# Patient Record
Sex: Female | Born: 1975 | Race: Black or African American | Hispanic: No | Marital: Single | Smoking: Former smoker
Health system: Southern US, Community
[De-identification: ages and names within clinical notes are randomized; demographics above are authoritative.]

## PROBLEM LIST (undated history)

## (undated) DIAGNOSIS — I4819 Other persistent atrial fibrillation: Secondary | ICD-10-CM

## (undated) DIAGNOSIS — E669 Obesity, unspecified: Secondary | ICD-10-CM

## (undated) DIAGNOSIS — N730 Acute parametritis and pelvic cellulitis: Secondary | ICD-10-CM

## (undated) DIAGNOSIS — I5022 Chronic systolic (congestive) heart failure: Secondary | ICD-10-CM

## (undated) HISTORY — DX: Acute parametritis and pelvic cellulitis: N73.0

---

## 2007-08-26 ENCOUNTER — Emergency Department (HOSPITAL_COMMUNITY): Admission: EM | Admit: 2007-08-26 | Discharge: 2007-08-26 | Payer: Self-pay | Admitting: Family Medicine

## 2007-09-07 ENCOUNTER — Emergency Department (HOSPITAL_COMMUNITY): Admission: EM | Admit: 2007-09-07 | Discharge: 2007-09-07 | Payer: Self-pay | Admitting: Family Medicine

## 2007-09-19 DIAGNOSIS — N730 Acute parametritis and pelvic cellulitis: Secondary | ICD-10-CM

## 2007-09-19 HISTORY — DX: Acute parametritis and pelvic cellulitis: N73.0

## 2008-02-08 ENCOUNTER — Encounter: Payer: Self-pay | Admitting: Emergency Medicine

## 2008-02-08 ENCOUNTER — Inpatient Hospital Stay (HOSPITAL_COMMUNITY): Admission: EM | Admit: 2008-02-08 | Discharge: 2008-02-11 | Payer: Self-pay | Admitting: Obstetrics & Gynecology

## 2008-03-11 ENCOUNTER — Ambulatory Visit: Payer: Self-pay | Admitting: Obstetrics and Gynecology

## 2008-03-11 ENCOUNTER — Ambulatory Visit (HOSPITAL_COMMUNITY): Admission: RE | Admit: 2008-03-11 | Discharge: 2008-03-11 | Payer: Self-pay | Admitting: Family Medicine

## 2008-03-11 ENCOUNTER — Encounter: Payer: Self-pay | Admitting: Obstetrics and Gynecology

## 2008-04-22 ENCOUNTER — Ambulatory Visit (HOSPITAL_COMMUNITY): Admission: RE | Admit: 2008-04-22 | Discharge: 2008-04-22 | Payer: Self-pay | Admitting: Family Medicine

## 2008-04-22 ENCOUNTER — Ambulatory Visit: Payer: Self-pay | Admitting: Obstetrics and Gynecology

## 2008-05-14 ENCOUNTER — Inpatient Hospital Stay (HOSPITAL_COMMUNITY): Admission: AD | Admit: 2008-05-14 | Discharge: 2008-05-14 | Payer: Self-pay | Admitting: Obstetrics & Gynecology

## 2008-09-02 ENCOUNTER — Ambulatory Visit: Payer: Self-pay | Admitting: Obstetrics & Gynecology

## 2009-06-16 ENCOUNTER — Emergency Department (HOSPITAL_COMMUNITY): Admission: EM | Admit: 2009-06-16 | Discharge: 2009-06-16 | Payer: Self-pay | Admitting: Emergency Medicine

## 2009-07-08 ENCOUNTER — Encounter: Admission: RE | Admit: 2009-07-08 | Discharge: 2009-07-30 | Payer: Self-pay | Admitting: Sports Medicine

## 2010-03-30 ENCOUNTER — Ambulatory Visit: Payer: Self-pay | Admitting: Obstetrics and Gynecology

## 2010-03-30 LAB — CONVERTED CEMR LAB: HCV Ab: NEGATIVE

## 2010-03-31 ENCOUNTER — Encounter: Payer: Self-pay | Admitting: Obstetrics and Gynecology

## 2010-03-31 LAB — CONVERTED CEMR LAB
Trich, Wet Prep: NONE SEEN
Yeast Wet Prep HPF POC: NONE SEEN

## 2010-06-15 ENCOUNTER — Ambulatory Visit: Payer: Self-pay | Admitting: Obstetrics and Gynecology

## 2010-06-16 ENCOUNTER — Encounter: Payer: Self-pay | Admitting: Obstetrics and Gynecology

## 2010-06-16 LAB — CONVERTED CEMR LAB: Yeast Wet Prep HPF POC: NONE SEEN

## 2011-01-31 NOTE — Group Therapy Note (Signed)
NAMEKORIE, STREAT NO.:  [id number]   MEDICAL RECORD NO.:  [id number]          PATIENT TYPE:  WOC   LOCATION:  [facility]                   FACILITY:  [facility]   PHYSICIAN:  Argentina Donovan, MD        DATE OF BIRTH:  03-31-1976   DATE OF SERVICE:                                  CLINIC NOTE   The patient is a 35 year old African American female gravida 2, para 1-1-  0-2 who was admitted to [facility] on Feb 08, 2008, with pelvic  abscess and treated for about 6 days with intravenous antibiotics.  The  patient today had a followup ultrasound which showed resolution of  complex cystic lesion in the left adnexa in the pelvic cul-de-sac since  her prior study consistent with resolution of tubo-ovarian abscess.   PHYSICAL EXAMINATION:  ABDOMEN:  On examination today the abdomen is  soft, flat, nontender.  No masses or organomegaly.  EXTERNAL GENITALIA:  Normal.  BUS within normal limits.  VAGINA:  Is clean and well rugated.  Cervix clean and parous.  Uterus in  normal size, shape and consistency and fullness in the cul-de-sac was  noted.  Adnexa could not be palpated due to habitus of the patient.   PLAN:  On this patient is to follow her up in about 6 weeks with another  ultrasound.  The patient is not experiencing any significant discomfort  but she did not have much when she was having the abscess active and  inflamed and so her pain tolerance is probably greater than most.  I  will have her schedule another appointment after her 6 week ultrasound  and evaluate her at that point.  I have talked about the possibility of  future surgery being necessary and also Pap smear was done today.   IMPRESSION:  Resolution of pelvic abscess.           ______________________________  Argentina Donovan, MD     PR/MEDQ  D:  03/11/2008  T:  03/11/2008  Job:  [id number]

## 2011-01-31 NOTE — H&P (Signed)
NAMEMISHIKA, FLIPPEN                ACCOUNT NO.:  [id number]   MEDICAL RECORD NO.:  [id number]          PATIENT TYPE:  INP   LOCATION:  9305                          [facility]   PHYSICIAN:  Kendra H. Tenny Craw, MD     DATE OF BIRTH:  Sep 20, 1975   DATE OF ADMISSION:  02/08/2008  DATE OF DISCHARGE:                              HISTORY & PHYSICAL   CHIEF COMPLAINT:  Abdominal pain.   HISTORY OF PRESENT ILLNESS:  Lisa Schaefer is a 35 year old G2, P 1-1-0-2  who presented to the [facility] Emergency Room this morning with a chief  complaint of vague abdominal pain that had been ongoing for several  days.  She states that her discomfort began on the evening of Wednesday,  Feb 05, 2008, with upper abdominal pain, emesis, and constipation.  She  felt poorly again on Thursday, but on Friday she was feeling better and  actually went to work.  She was planning to go to work today; however,  upon the urging of her mother, she presented to the emergency room for  evaluation.  She attributed her symptoms to constipation as she has not  had a bowel movement since Tuesday.  She does endorse running a fever at  home, the highest of which was 101.  Her last menstrual period started  on Feb 06, 2008.  She does note some increase in vaginal discharge over  the last several days; however, attributed this to her pending menses  and denies any vaginal irritation.   PAST MEDICAL HISTORY:  None.   PAST SURGICAL HISTORY:  None.   PAST OBSTETRICS HISTORY:  Gravida 2, para 1-1-0-1.  She is status post  spontaneous vaginal delivery x2.  In 1993, she had a full-term  spontaneous vaginal delivery without complications of a 6 pound 5 ounce  baby.  In 1993, she had a 32-week spontaneous vaginal delivery after  premature preterm rupture of membranes.  The baby weighing 3 pounds 12  ounces.   PAST GYN HISTORY:  She endorses frequent abnormal Pap smears.  Her last  Pap smear was normal.  However, she has had one in  several years.  She  does endorse a history of gonorrhea and Chlamydia 5 years ago.  She  denies any history of pelvic inflammatory disease.  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  Her last outbreak of which was 2-3  months ago.  She is not currently using anything for contraception and  not attempting pregnancy.  Her last sexual intercourse was 3 weeks ago  with the same partner that she has been with for the past 1-1/2 years.  She denies any other sexual partners.  She denies condom use.  She has  no knowledge of her partner having other sexual partners or having any  symptoms of sexually transmitted diseases.   SOCIAL HISTORY:  Positive for half a pack of tobacco daily and endorses  some social alcohol use, 1-2 drinks weekly.  She denies drug abuse.  She  currently works as an elder Engineer, manufacturing systems.   FAMILY HISTORY:  A sister with hypertension.  Mother with diabetes  mellitus.  Father who is deceased at age 82 secondary to colon cancer.  Maternal grandmother with breast cancer.  She denies any family history  of uterine or ovarian cancer.   MEDICATIONS:  None.   ALLERGIES:  No known drug allergies.   PHYSICAL EXAMINATION:  VITAL SIGNS:  Afebrile, temperature current 98.9,  respirations 18, pulse 88-101, and blood pressure is 128-152/80-92.  Alert and oriented x3, in no apparent distress.  HEENT:  Moist mucous membranes.  Sclerae anicteric.  NECK: Supple.  No thyromegaly.  No cervical, supraclavicular, or  axillary lymphadenopathy.  HEART:  Regular rate and rhythm, a 2/6 murmur, best heard at the left  upper sternal border is noted.  ABDOMEN:  Soft, nontender, and nondistended.  No rebound or guarding is  noted.  PELVIC:  On bimanual exam, normal external female genitalia.  Vagina is  pink with positive menstrual fluid in the vaginal vault.  The cervix is  without lesions.  No mucopurulent discharge is noted.  No cervical  motion tenderness is noted.  The uterus  is mobile, approximately 8-week  size.  Upon palpation of the posterior uterus, the patient is slightly  tender.  The adnexa are nonpalpable bilaterally.  The patient endorses  slight tenderness on the left with deep palpation.   A CT of the abdomen and pelvis was performed at [facility] Emergency  Room, which demonstrated cholelithiasis and involving the left adnexa, a  fluid filled mass 4.3 x 5.4 cm which was consistent with abscess.  Inferior to this was another fluid collection, which may be an abscess  versus a hydrosalpinx and another fluid collection in the posterior cul-  de-sac, anterior to the rectum measuring 4.2 x 4.2, and also suspicious  for abscess.  Edema of the soft tissue of the pelvis is noted.   LABORATORY DATA:  White count 13, hemoglobin 13.9, hematocrit 41.1, and  platelets 374,000.  Sodium 136, potassium 3.3, chloride 102, carbon  dioxide 25, BUN 6, creatinine 0.8, glucose 106, AST 19, ALT 32, and  total bilirubin is slightly elevated at 1.3.  Urinalysis demonstrates  large hemoglobin, positive nitrites, and positive leukocyte esterase, 3-  6 white cells, 21-50 red cells, and mucus is present.  Gonorrhea and  Chlamydia cultures and RPR were collected in [facility] Emergency Room.  These were also recollected in the maternity admissions unit.  Urine  pregnancy test was negative.   ASSESSMENT/PLAN:  This is a 35 year old G2, P1-1-0-2 admitted for pelvic  inflammatory disease and probable left tubo-ovarian abscess and pelvic  abscesses.  She will be admitted to the GYN floor, started on  ampicillin, gentamicin, and clindamycin for pelvic inflammatory disease  and tubo-ovarian abscess.  Gonorrhea, Chlamydia, and wet preps are  pending.  A urine culture is pending.  We will check her CBC in the a.m.      Freddrick March. Tenny Craw, MD  Electronically Signed     KHR/MEDQ  D:  02/08/2008  T:  02/09/2008  Job:  [id number]

## 2011-02-03 NOTE — Discharge Summary (Signed)
NAMEADRIENNA, KARIS                ACCOUNT NO.:  [id number]   MEDICAL RECORD NO.:  [id number]          PATIENT TYPE:  INP   LOCATION:  9305                          [facility]   PHYSICIAN:  Kendra H. Tenny Craw, MD     DATE OF BIRTH:  [birth year]   DATE OF ADMISSION:  02/08/2008  DATE OF DISCHARGE:  02/11/2008                               DISCHARGE SUMMARY   Discharge Diagnoses:  1. Pelvic Inflammitory disease  2. Multiple pelvic asbcsesses   Hospital Procedures  1. CT scan abdomen and pelvis  2. IV antibiotics   Ms. Blackham is a 35 year old G2 P1-1-0-2 who was admitted to the hospital  on Feb 08, 2008 after a CT scan performed for abdominal pain  demonstrated multiple pelvic abscesses consistent with pelvic  inflammatory disease and tubo-ovarian abscesses.  Please see her  admission history and physical for full details of her history.  During  her hospitalization, she received IV antibiotics and pain medication.  On hospital day #4, she had defervesced her fever and was tolerating  regular diet and ambulating well, the decision was made to discharge  home.  She received prescriptions for Augmentin and Flagyl to take  orally at home and was instructed to follow up with the GYN Clinic for  further management.  On Feb 11, 2008, she was discharged home.      Freddrick March. Tenny Craw, MD  Electronically Signed     KHR/MEDQ  D:  05/31/2008  T:  06/01/2008  Job:  [id number]

## 2011-05-24 ENCOUNTER — Ambulatory Visit (INDEPENDENT_AMBULATORY_CARE_PROVIDER_SITE_OTHER): Payer: Medicaid Other | Admitting: Family Medicine

## 2011-05-24 ENCOUNTER — Other Ambulatory Visit: Payer: Self-pay | Admitting: Family Medicine

## 2011-05-24 ENCOUNTER — Other Ambulatory Visit (HOSPITAL_COMMUNITY)
Admission: RE | Admit: 2011-05-24 | Discharge: 2011-05-24 | Disposition: A | Discharge status: Home / Self Care | Payer: Medicaid Other | Source: Ambulatory Visit | Attending: Family Medicine | Admitting: Family Medicine

## 2011-05-24 VITALS — BP 142/88 | HR 63 | Temp 98.1°F | Ht 70.0 in | Wt 234.7 lb

## 2011-05-24 DIAGNOSIS — Z01419 Encounter for gynecological examination (general) (routine) without abnormal findings: Secondary | ICD-10-CM

## 2011-05-24 DIAGNOSIS — Z0000 Encounter for general adult medical examination without abnormal findings: Secondary | ICD-10-CM

## 2011-05-24 DIAGNOSIS — N926 Irregular menstruation, unspecified: Secondary | ICD-10-CM

## 2011-05-24 DIAGNOSIS — Z113 Encounter for screening for infections with a predominantly sexual mode of transmission: Secondary | ICD-10-CM | POA: Insufficient documentation

## 2011-05-24 LAB — POCT PREGNANCY, URINE: Preg Test, Ur: NEGATIVE

## 2011-05-24 NOTE — Patient Instructions (Signed)
Your pregnancy test today was negative. Please see your family doctor as discussed regarding your blood pressure. If your pap is normal, you will get a letter in the mail regarding this. You should have yearly pap testing because of your previous abnormality that required treatment.  Hypertension (High Blood Pressure) As your heart beats, it forces blood through your arteries. This force is your blood pressure. If the pressure is too high, it is called hypertension (HTN) or high blood pressure. HTN is dangerous because you may have it and not know it. High blood pressure may mean that your heart has to work harder to pump blood. Your arteries may be narrow or stiff. The extra work puts you at risk for heart disease, stroke, and other problems.  Blood pressure consists of two numbers, a higher number over a lower, 110/72, for example. It is stated as "110 over 72." The ideal is below 120 for the top number (systolic) and under 80 for the bottom (diastolic). Write down your blood pressure today. You should pay close attention to your blood pressure if you have certain conditions such as:  Heart failure.  Prior heart attack.   Diabetes   Chronic kidney disease.   Prior stroke.   Multiple risk factors for heart disease.   To see if you have HTN, your blood pressure should be measured while you are seated with your arm held at the level of the heart. It should be measured at least twice. A one-time elevated blood pressure reading (especially in the Emergency Department) does not mean that you need treatment. There may be conditions in which the blood pressure is different between your right and left arms. It is important to see your caregiver soon for a recheck. Most people have essential hypertension which means that there is not a specific cause. This type of high blood pressure may be lowered by changing lifestyle factors such as:  Stress.  Smoking.   Lack of exercise.   Excessive  weight.  Drug/tobacco/alcohol use.   Eating less salt.   Most people do not have symptoms from high blood pressure until it has caused damage to the body. Effective treatment can often prevent, delay or reduce that damage. TREATMENT Treatment for high blood pressure, when a cause has been identified, is directed at the cause. There are a large number of medications to treat HTN. These fall into several categories, and your caregiver will help you select the medicines that are best for you. Medications may have side effects. You should review side effects with your caregiver. If your blood pressure stays high after you have made lifestyle changes or started on medicines,   Your medication(s) may need to be changed.   Other problems may need to be addressed.   Be certain you understand your prescriptions, and know how and when to take your medicine.   Be sure to follow up with your caregiver within the time frame advised (usually within two weeks) to have your blood pressure rechecked and to review your medications.   If you are taking more than one medicine to lower your blood pressure, make sure you know how and at what times they should be taken. Taking two medicines at the same time can result in blood pressure that is too low.  SEEK IMMEDIATE MEDICAL CARE IF YOU DEVELOP:  A severe headache, blurred or changing vision, or confusion.   Unusual weakness or numbness, or a faint feeling.   Severe chest or abdominal pain,  vomiting, or breathing problems.  MAKE SURE YOU:   Understand these instructions.   Will watch your condition.   Will get help right away if you are not doing well or get worse.  Document Released: 09/04/2005 Document Re-Released: 02/22/2010 [facility] Patient Information 2011 [place], [place].

## 2011-05-24 NOTE — Progress Notes (Signed)
Subjective:     Lisa Schaefer is a 35 y.o. female here for a routine exam.  Current complaints: reports only spotting in Aug instead of having a period. She went to the HD for her DTaP booster and had a negative pregnancy test for this, but would like another pregnancy test today. She denies any vaginal complaints or medical concerns at this time.  She reports that she had an abnormal pap around 10-15 years ago that required cryotherapy, but she does not recall what the exact abnormality was. She has had normal paps since per her report. She has a history of 2 pregnancies and 2 vaginal deliveries, one at term and one preterm.   Gynecologic History Patient's last menstrual period was 05/15/2011. Contraception: condoms Last Pap: 2011. Results were: normal  Obstetric History OB History    Grav Para Term Preterm Abortions TAB SAB Ect Mult Living   2 2 1 1      2      # Outc Date GA Lbr Len/2nd Wgt Sex Del Anes PTL Lv   1 TRM            2 PRE                The following portions of the patient's history were reviewed and updated as appropriate: allergies, current medications, past family history, past medical history, past social history, past surgical history and problem list.  Review of Systems Constitutional: negative Respiratory: negative Cardiovascular: negative Gastrointestinal: negative Genitourinary:negative    Objective:    General appearance: alert, cooperative, appears stated age, no distress and moderately obese Neck: no adenopathy and thyroid not enlarged, symmetric, no tenderness/mass/nodules Lungs: clear to auscultation bilaterally Breasts: exam declined Heart: regular rate and rhythm, S1, S2 normal, no murmur, click, rub or gallop Abdomen: soft, non-tender; bowel sounds normal; no masses,  no organomegaly Pelvic: cervix normal in appearance, external genitalia normal, no adnexal masses or tenderness, no cervical motion tenderness, rectovaginal septum normal, uterus  normal size, shape, and consistency and vagina normal without discharge Skin: Skin color, texture, turgor normal. No rashes or lesions    Assessment:    Healthy female exam.  Elevated BP noted on exam today. Not pregnant. History of abnormal pap requiring cryotherapy approximately 15 years ago.   Plan:  Patient states she will follow up with her family doctor regarding her blood pressure elevation noted today.  Contraception: condoms. Follow up in: 12 months. for routine yearly pap testing if this pap is normal.

## 2011-05-25 LAB — WET PREP, GENITAL: Yeast Wet Prep HPF POC: NONE SEEN

## 2011-06-14 LAB — COMPREHENSIVE METABOLIC PANEL
ALT: 32
AST: 19
Albumin: 3.1 — ABNORMAL LOW
Alkaline Phosphatase: 82
Potassium: 3.3 — ABNORMAL LOW
Sodium: 136
Total Protein: 6.9

## 2011-06-14 LAB — DIFFERENTIAL
Basophils Relative: 0
Eosinophils Absolute: 0
Lymphocytes Relative: 18
Lymphs Abs: 1.6
Monocytes Absolute: 0.6
Monocytes Absolute: 0.9
Monocytes Relative: 11
Monocytes Relative: 5
Neutro Abs: 6

## 2011-06-14 LAB — CBC
Hemoglobin: 12.3
Hemoglobin: 13.9
Platelets: 374
RBC: 3.92
RDW: 13.8

## 2011-06-14 LAB — URINALYSIS, ROUTINE W REFLEX MICROSCOPIC
Glucose, UA: NEGATIVE
Ketones, ur: 15 — AB
Ketones, ur: NEGATIVE
Leukocytes, UA: NEGATIVE
Nitrite: NEGATIVE
Protein, ur: 100 — AB
Protein, ur: NEGATIVE
pH: 6

## 2011-06-14 LAB — GC/CHLAMYDIA PROBE AMP, GENITAL
Chlamydia, DNA Probe: NEGATIVE
GC Probe Amp, Genital: NEGATIVE

## 2011-06-14 LAB — WET PREP, GENITAL: Clue Cells Wet Prep HPF POC: NONE SEEN

## 2011-06-14 LAB — PREGNANCY, URINE: Preg Test, Ur: NEGATIVE

## 2011-06-14 LAB — URINE MICROSCOPIC-ADD ON

## 2011-06-14 LAB — RPR: RPR Ser Ql: NONREACTIVE

## 2011-08-24 ENCOUNTER — Telehealth: Payer: Self-pay | Admitting: *Deleted

## 2011-08-24 NOTE — Telephone Encounter (Signed)
Shatonya called and left a message my periods are crazy right now. One month when I came there for my pad I didn't have a period then I've had 2 periods a month . Could something be wrong?

## 2011-08-24 NOTE — Telephone Encounter (Signed)
Called patient and left a message we are returning her call. Please call clinic back

## 2011-08-25 NOTE — Telephone Encounter (Signed)
Called pt and pt informed me the same info as below.  I informed her that she would need to be evaluated by a provider to make sure she gets properly tx'd. Pt stated understanding and I transferred her to the front desk to schedule an appt.

## 2011-09-20 ENCOUNTER — Ambulatory Visit (INDEPENDENT_AMBULATORY_CARE_PROVIDER_SITE_OTHER): Payer: Self-pay | Admitting: Family

## 2011-09-20 ENCOUNTER — Encounter: Payer: Self-pay | Admitting: Family

## 2011-09-20 DIAGNOSIS — N921 Excessive and frequent menstruation with irregular cycle: Secondary | ICD-10-CM

## 2011-09-20 DIAGNOSIS — N920 Excessive and frequent menstruation with regular cycle: Secondary | ICD-10-CM | POA: Insufficient documentation

## 2011-09-20 LAB — POCT PREGNANCY, URINE: Preg Test, Ur: NEGATIVE

## 2011-09-20 NOTE — Progress Notes (Signed)
  Subjective:    Patient ID: Lisa Schaefer, female    DOB: 1976/06/11, 36 y.o.   MRN: [id number]  HPI Pt is here with report of irregular vaginal bleeding that started after diagnosed with PID in 2009.  Reports bleeding occurs every 16-18 days.  Bleeding is described from light to moderate with moderate cramping.     Review of Systems  Constitutional: Negative.   HENT: Negative.   Cardiovascular: Negative.   Gastrointestinal: Positive for abdominal distention.  Genitourinary: Positive for vaginal bleeding and menstrual problem. Negative for dysuria, urgency, hematuria, vaginal discharge, difficulty urinating, pelvic pain and dyspareunia.  Musculoskeletal: Negative.        Objective:   Physical Exam  Constitutional: She is oriented to person, place, and time. She appears well-developed and well-nourished. No distress.  HENT:  Head: Normocephalic.  Neck: Normal range of motion. Neck supple.  Cardiovascular: Normal rate, regular rhythm and normal heart sounds.   Pulmonary/Chest: Effort normal and breath sounds normal.  Abdominal: Soft. There is tenderness (midpelvic region).  Genitourinary: Vagina normal. No vaginal discharge found.       Difficult to palpate uterus and adnexa due to weight  Musculoskeletal: Normal range of motion.  Neurological: She is alert and oriented to person, place, and time.   Urine pregnancy - negative       Assessment & Plan:  Irregular Bleeding  Plan: Labs - CBC, TSH, GC/CT Schedule pelvic ultrasound Follow-up in 2 weeks for results and possible endometrial biopsy

## 2011-09-20 NOTE — Patient Instructions (Signed)
Abnormal Uterine Bleeding Abnormal uterine bleeding can have many causes. Some cases are simply treated, while others are more serious. There are several kinds of bleeding that is considered abnormal, including:  Bleeding between periods.     Bleeding after sexual intercourse.     Spotting anytime in the menstrual cycle.     Bleeding heavier or more than normal.     Bleeding after menopause.  CAUSES   There are many causes of abnormal uterine bleeding. It can be present in teenagers, pregnant women, women during their reproductive years, and women who have reached menopause. Your caregiver will look for the more common causes depending on your age, signs, symptoms and your particular circumstance. Most cases are not serious and can be treated. Even the more serious causes, like cancer of the female organs, can be treated adequately if found in the early stages. That is why all types of bleeding should be evaluated and treated as soon as possible. DIAGNOSIS   Diagnosing the cause may take several kinds of tests. Your caregiver may:  Take a complete history of the type of bleeding.     Perform a complete physical exam and Pap smear.     Take an ultrasound on the abdomen showing a picture of the female organs and the pelvis.     Inject dye into the uterus and Fallopian tubes and X-ray them (hysterosalpingogram).     Place fluid in the uterus and do an ultrasound (sonohysterogrqphy).     Take a CT scan to examine the female organs and pelvis.     Take an MRI to examine the female organs and pelvis. There is no X-ray involved with this procedure.     Look inside the uterus with a telescope that has a light at the end (hysteroscopy).     Scrap the inside of the uterus to get tissue to examine (Dilatation and Curettage, D&C).     Look into the pelvis with a telescope that has a light at the end (laparoscopy). This is done through a very small cut (incision) in the abdomen.  TREATMENT     Treatment will depend on the cause of the abnormal bleeding. It can include:  Doing nothing to allow the problem to take care of itself over time.     Hormone treatment.     Birth control pills.     Treating the medical condition causing the problem.     Laparoscopy.    Major or minor surgery     Destroying the lining of the uterus with electrical currant, laser, freezing or heat (uterine ablation).  HOME CARE INSTRUCTIONS    Follow your caregiver's recommendation on how to treat your problem.     See your caregiver if you missed a menstrual period and think you may be pregnant.     If you are bleeding heavily, count the number of pads/tampons you use and how often you have to change them. Tell this to your caregiver.     Avoid sexual intercourse until the problem is controlled.  SEEK MEDICAL CARE IF:    You have any kind of abnormal bleeding mentioned above.     You feel dizzy at times.     You are 36 years old and have not had a menstrual period yet.  SEEK IMMEDIATE MEDICAL CARE IF:    You pass out.     You are changing pads/tampons every 15 to 30 minutes.     You have belly (abdominal)  pain.     You have a temperature of 100 F (37.8 C) or higher.     You become sweaty or weak.     You are passing large blood clots from the vagina.     You start to feel sick to your stomach (nauseous) and throw up (vomit).  Document Released: 09/04/2005 Document Revised: 05/17/2011 Document Reviewed: 01/28/2009 [facility] Patient Information 2012 [place], [place].

## 2011-09-21 LAB — CBC
HCT: 43.7 % (ref 36.0–46.0)
Hemoglobin: 15 g/dL (ref 12.0–15.0)
MCH: 31.8 pg (ref 26.0–34.0)
MCV: 92.8 fL (ref 78.0–100.0)
RBC: 4.71 MIL/uL (ref 3.87–5.11)

## 2011-09-27 ENCOUNTER — Ambulatory Visit (HOSPITAL_COMMUNITY): Payer: Self-pay

## 2011-10-02 ENCOUNTER — Ambulatory Visit (HOSPITAL_COMMUNITY)
Admission: RE | Admit: 2011-10-02 | Discharge: 2011-10-02 | Disposition: A | Discharge status: Home / Self Care | Payer: Medicaid Other | Source: Ambulatory Visit | Attending: Family | Admitting: Family

## 2011-10-02 DIAGNOSIS — N949 Unspecified condition associated with female genital organs and menstrual cycle: Secondary | ICD-10-CM | POA: Insufficient documentation

## 2011-10-02 DIAGNOSIS — N921 Excessive and frequent menstruation with irregular cycle: Secondary | ICD-10-CM

## 2011-10-02 DIAGNOSIS — N938 Other specified abnormal uterine and vaginal bleeding: Secondary | ICD-10-CM | POA: Insufficient documentation

## 2011-10-16 ENCOUNTER — Ambulatory Visit: Payer: Self-pay | Admitting: Family

## 2011-10-30 ENCOUNTER — Ambulatory Visit (INDEPENDENT_AMBULATORY_CARE_PROVIDER_SITE_OTHER): Payer: Self-pay | Admitting: Family

## 2011-10-30 ENCOUNTER — Encounter: Payer: Self-pay | Admitting: Family

## 2011-10-30 VITALS — BP 143/82 | HR 86 | Temp 98.0°F | Ht 70.0 in | Wt 251.0 lb

## 2011-10-30 DIAGNOSIS — N920 Excessive and frequent menstruation with regular cycle: Secondary | ICD-10-CM

## 2011-10-30 NOTE — Patient Instructions (Signed)
Endometrial Biopsy This is a test in which a tissue sample (a biopsy) is taken from inside the uterus (womb). It is then looked at by a specialist under a microscope to see if the tissue is normal or abnormal. The endometrium is the lining of the uterus. This test helps determine where you are in your menstrual cycle and how hormone levels are affecting the lining of the uterus. Another use for this test is to diagnose endometrial cancer, tuberculosis, polyps, or inflammatory conditions and to evaluate uterine bleeding. PREPARATION FOR TEST No preparation or fasting is necessary. NORMAL FINDINGS No pathologic conditions. Presence of "secretory-type" endometrium 3 to 5 days before to normal menstruation. Ranges for normal findings may vary among different laboratories and hospitals. You should always check with your doctor after having lab work or other tests done to discuss the meaning of your test results and whether your values are considered within normal limits. MEANING OF TEST  Your caregiver will go over the test results with you and discuss the importance and meaning of your results, as well as treatment options and the need for additional tests if necessary. OBTAINING THE TEST RESULTS It is your responsibility to obtain your test results. Ask the lab or department performing the test when and how you will get your results. Document Released: 01/05/2005 Document Revised: 05/17/2011 Document Reviewed: 08/14/2008 [employer] Patient Information 2012 [employer]. 

## 2011-10-30 NOTE — Progress Notes (Signed)
  Subjective:    Patient ID: Lisa Schaefer, female    DOB: [birth year], 36 y.o.   MRN: [id number]  HPI Pt here for results from prior GYN clinic visit for irregular bleeding.  Results were as follows: CBC    Component Value Date/Time   WBC 7.6 09/20/2011 1709   RBC 4.71 09/20/2011 1709   HGB 15.0 09/20/2011 1709   HCT 43.7 09/20/2011 1709   PLT 246 09/20/2011 1709   MCV 92.8 09/20/2011 1709   MCH 31.8 09/20/2011 1709   MCHC 34.3 09/20/2011 1709   RDW 13.5 09/20/2011 1709   LYMPHSABS 1.6 02/09/2008 0536   MONOABS 0.9 02/09/2008 0536   EOSABS 0.1 02/09/2008 0536   BASOSABS 0.0 02/09/2008 0536   TSH - nml; GC/CT neg x2;   Ultrasound:  Incidental note of a hemorrhagic right ovarian physiologic cyst.  Otherwise normal exam, without focal abnormality to explain the  history of vaginal bleeding.  Pt desires to have endometrial biopsy; will schedule appointment.  Review of Systems  Genitourinary: Positive for menstrual problem.  All other systems reviewed and are negative.       Objective:   Physical Exam  Constitutional: She is oriented to person, place, and time. She appears well-developed and well-nourished. No distress.  HENT:  Head: Normocephalic and atraumatic.  Neck: Normal range of motion. Neck supple. No thyromegaly present.  Abdominal: Bowel sounds are normal.          Neurological: She is alert and oriented to person, place, and time.  Skin: Skin is warm and dry.          Assessment & Plan:  Menorrhagia Hemorrhagic Cyst  Plan: Follow-up in 2 weeks for endometrial biopsy [facility]

## 2011-11-15 ENCOUNTER — Encounter: Payer: Self-pay | Admitting: Obstetrics and Gynecology

## 2011-11-15 ENCOUNTER — Ambulatory Visit (INDEPENDENT_AMBULATORY_CARE_PROVIDER_SITE_OTHER): Payer: Self-pay | Admitting: Obstetrics and Gynecology

## 2011-11-15 VITALS — BP 149/90 | HR 67 | Temp 97.8°F | Resp 20 | Ht 70.0 in | Wt 242.1 lb

## 2011-11-15 DIAGNOSIS — N920 Excessive and frequent menstruation with regular cycle: Secondary | ICD-10-CM

## 2011-11-15 DIAGNOSIS — Z01812 Encounter for preprocedural laboratory examination: Secondary | ICD-10-CM

## 2011-11-15 LAB — POCT PREGNANCY, URINE: Preg Test, Ur: NEGATIVE

## 2011-11-15 NOTE — Progress Notes (Signed)
UPT:negative

## 2011-11-15 NOTE — Patient Instructions (Signed)
Biopsy results will take up to one week. Will schedule follow up appointment in 2 weeks. Nothing in the vagina for 3 days.    Endometrial Biopsy This is a test in which a tissue sample (a biopsy) is taken from inside the uterus (womb). It is then looked at by a specialist under a microscope to see if the tissue is normal or abnormal. The endometrium is the lining of the uterus. This test helps determine where you are in your menstrual cycle and how hormone levels are affecting the lining of the uterus. Another use for this test is to diagnose endometrial cancer, tuberculosis, polyps, or inflammatory conditions and to evaluate uterine bleeding. PREPARATION FOR TEST No preparation or fasting is necessary. NORMAL FINDINGS No pathologic conditions. Presence of "secretory-type" endometrium 3 to 5 days before to normal menstruation. Ranges for normal findings may vary among different laboratories and hospitals. You should always check with your doctor after having lab work or other tests done to discuss the meaning of your test results and whether your values are considered within normal limits. MEANING OF TEST  Your caregiver will go over the test results with you and discuss the importance and meaning of your results, as well as treatment options and the need for additional tests if necessary. OBTAINING THE TEST RESULTS It is your responsibility to obtain your test results. Ask the lab or department performing the test when and how you will get your results. Document Released: 01/05/2005 Document Revised: 05/17/2011 Document Reviewed: 08/14/2008 [facility] Patient Information 2012 [place], [place].

## 2011-11-15 NOTE — Progress Notes (Signed)
  Subjective:    Patient ID: Lisa Schaefer, female    DOB: 1976/07/04, 36 y.o.   MRN: [id number]  HPI  Presents for endometrial biopsy today after persistent irregular cycles. Started menses 2 days ago. Feels well otherwise. No abdominal pain.   Review of Systems See HPI. Nonsmoker. Maternal GM history unspecified cancer.     Objective:   Physical Exam  Vitals reviewed. Constitutional: She is oriented to person, place, and time. She appears well-developed and well-nourished. No distress.  HENT:  Head: Normocephalic and atraumatic.  Abdominal: Soft.  Genitourinary: Vagina normal and uterus normal.       Cervical bleeding prior to procedure. No motion tenderness.  Neurological: She is alert and oriented to person, place, and time. Coordination normal.  Skin: No rash noted.       Assessment & Plan:  Endometrial Biopsy Procedure Note  Pre-operative Diagnosis: Dysfunctional uterine bleeding  Post-operative Diagnosis: same  Indications: abnormal uterine bleeding  Procedure Details   Urine pregnancy test was done and result was neg.  The risks (including infection, bleeding, pain, and uterine perforation) and benefits of the procedure were explained to the patient and Written informed consent was obtained.  Antibiotic prophylaxis against endocarditis was not indicated.   The patient was placed in the dorsal lithotomy position.  Bimanual exam showed the uterus to be in the anteroflexed position.  A Graves' speculum inserted in the vagina, and the cervix prepped with povidone iodine.  Endocervical curettage with a Kevorkian curette was performed.   A sharp tenaculum was applied to the anterior lip of the cervix for stabilization.  A sterile uterine sound was used to sound the uterus to a depth of 5.5cm.  A Pipelle endometrial aspirator was used to sample the endometrium.  Sample was sent for pathologic examination.  Condition: Stable  Complications: None  Plan:  The patient was  advised to call for any fever or for prolonged or severe pain or bleeding. She was advised to use OTC acetaminophen as needed for mild to moderate pain. She was advised to avoid vaginal intercourse for 48 hours or until the bleeding has completely stopped.  F/u in 2 weeks to discuss biopsy results.  Attending Physician Documentation:  Leola Brazil was present and assisted with procedure.

## 2011-11-16 ENCOUNTER — Other Ambulatory Visit (HOSPITAL_COMMUNITY)
Admission: RE | Admit: 2011-11-16 | Discharge: 2011-11-16 | Disposition: A | Discharge status: Home / Self Care | Payer: Self-pay | Source: Ambulatory Visit | Attending: Obstetrics and Gynecology | Admitting: Obstetrics and Gynecology

## 2011-11-16 DIAGNOSIS — N920 Excessive and frequent menstruation with regular cycle: Secondary | ICD-10-CM | POA: Insufficient documentation

## 2011-11-16 NOTE — Progress Notes (Signed)
Addended by: Gerome Apley on: 11/16/2011 11:42 AM   Modules accepted: Orders

## 2011-12-18 ENCOUNTER — Ambulatory Visit (INDEPENDENT_AMBULATORY_CARE_PROVIDER_SITE_OTHER): Payer: Medicaid Other | Admitting: Obstetrics and Gynecology

## 2011-12-18 ENCOUNTER — Encounter: Payer: Self-pay | Admitting: Obstetrics and Gynecology

## 2011-12-18 VITALS — BP 139/94 | HR 77 | Temp 97.4°F | Ht 70.0 in | Wt 240.7 lb

## 2011-12-18 DIAGNOSIS — N926 Irregular menstruation, unspecified: Secondary | ICD-10-CM

## 2011-12-18 DIAGNOSIS — F17200 Nicotine dependence, unspecified, uncomplicated: Secondary | ICD-10-CM

## 2011-12-18 NOTE — Patient Instructions (Addendum)
Keep a menstrual calendar. Smoking Cessation This document explains the best ways for you to quit smoking and new treatments to help. It lists new medicines that can double or triple your chances of quitting and quitting for good. It also considers ways to avoid relapses and concerns you may have about quitting, including weight gain. NICOTINE: A POWERFUL ADDICTION If you have tried to quit smoking, you know how hard it can be. It is hard because nicotine is a very addictive drug. For some people, it can be as addictive as heroin or cocaine. Usually, people make 2 or 3 tries, or more, before finally being able to quit. Each time you try to quit, you can learn about what helps and what hurts. Quitting takes hard work and a lot of effort, but you can quit smoking. QUITTING SMOKING IS ONE OF THE MOST IMPORTANT THINGS YOU WILL EVER DO.  You will live longer, feel better, and live better.   The impact on your body of quitting smoking is felt almost immediately:   Within 20 minutes, blood pressure decreases. Pulse returns to its normal level.   After 8 hours, carbon monoxide levels in the blood return to normal. Oxygen level increases.   After 24 hours, chance of heart attack starts to decrease. Breath, hair, and body stop smelling like smoke.   After 48 hours, damaged nerve endings begin to recover. Sense of taste and smell improve.   After 72 hours, the body is virtually free of nicotine. Bronchial tubes relax and breathing becomes easier.   After 2 to 12 weeks, lungs can hold more air. Exercise becomes easier and circulation improves.   Quitting will reduce your risk of having a heart attack, stroke, cancer, or lung disease:   After 1 year, the risk of coronary heart disease is cut in half.   After 5 years, the risk of stroke falls to the same as a nonsmoker.   After 10 years, the risk of lung cancer is cut in half and the risk of other cancers decreases significantly.   After 15 years,  the risk of coronary heart disease drops, usually to the level of a nonsmoker.   If you are pregnant, quitting smoking will improve your chances of having a healthy baby.   The people you live with, especially your children, will be healthier.   You will have extra money to spend on things other than cigarettes.  FIVE KEYS TO QUITTING Studies have shown that these 5 steps will help you quit smoking and quit for good. You have the best chances of quitting if you use them together: 1. Get ready.  2. Get support and encouragement.  3. Learn new skills and behaviors.  4. Get medicine to reduce your nicotine addiction and use it correctly.  5. Be prepared for relapse or difficult situations. Be determined to continue trying to quit, even if you do not succeed at first.  1. GET READY  Set a quit date.   Change your environment.   Get rid of ALL cigarettes, ashtrays, matches, and lighters in your home, car, and place of work.   Do not let people smoke in your home.   Review your past attempts to quit. Think about what worked and what did not.   Once you quit, do not smoke. NOT EVEN A PUFF!  2. GET SUPPORT AND ENCOURAGEMENT Studies have shown that you have a better chance of being successful if you have help. You can get support in  many ways.  Tell your family, friends, and coworkers that you are going to quit and need their support. Ask them not to smoke around you.   Talk to your caregivers (doctor, dentist, nurse, pharmacist, psychologist, and/or smoking counselor).   Get individual, group, or telephone counseling and support. The more counseling you have, the better your chances are of quitting. Programs are available at [employer] and health centers. Call your local health department for information about programs in your area.   Spiritual beliefs and practices may help some smokers quit.   Quit meters are Photographer that keep track of quit  statistics, such as amount of "quit-time," cigarettes not smoked, and money saved.   Many smokers find one or more of the many self-help books available useful in helping them quit and stay off tobacco.  3. LEARN NEW SKILLS AND BEHAVIORS  Try to distract yourself from urges to smoke. Talk to someone, go for a walk, or occupy your time with a task.   When you first try to quit, change your routine. Take a different route to work. Drink tea instead of coffee. Eat breakfast in a different place.   Do something to reduce your stress. Take a hot bath, exercise, or read a book.   Plan something enjoyable to do every day. Reward yourself for not smoking.   Explore interactive web-based programs that specialize in helping you quit.  4. GET MEDICINE AND USE IT CORRECTLY Medicines can help you stop smoking and decrease the urge to smoke. Combining medicine with the above behavioral methods and support can quadruple your chances of successfully quitting smoking. The U.[place] Food and Drug Administration (FDA) has approved 7 medicines to help you quit smoking. These medicines fall into 3 categories.  Nicotine replacement therapy (delivers nicotine to your body without the negative effects and risks of smoking):   Nicotine gum: Available over-the-counter.   Nicotine lozenges: Available over-the-counter.   Nicotine inhaler: Available by prescription.   Nicotine nasal spray: Available by prescription.   Nicotine skin patches (transdermal): Available by prescription and over-the-counter.   Antidepressant medicine (helps people abstain from smoking, but how this works is unknown):   Bupropion sustained-release (SR) tablets: Available by prescription.   Nicotinic receptor partial agonist (simulates the effect of nicotine in your brain):   Varenicline tartrate tablets: Available by prescription.   Ask your caregiver for advice about which medicines to use and how to use them. Carefully read the  information on the package.   Everyone who is trying to quit may benefit from using a medicine. If you are pregnant or trying to become pregnant, nursing an infant, you are under age 44, or you smoke fewer than 10 cigarettes per day, talk to your caregiver before taking any nicotine replacement medicines.   You should stop using a nicotine replacement product and call your caregiver if you experience nausea, dizziness, weakness, vomiting, fast or irregular heartbeat, mouth problems with the lozenge or gum, or redness or swelling of the skin around the patch that does not go away.   Do not use any other product containing nicotine while using a nicotine replacement product.   Talk to your caregiver before using these products if you have diabetes, heart disease, asthma, stomach ulcers, you had a recent heart attack, you have high blood pressure that is not controlled with medicine, a history of irregular heartbeat, or you have been prescribed medicine to help you quit smoking.  5. BE  PREPARED FOR RELAPSE OR DIFFICULT SITUATIONS  Most relapses occur within the first 3 months after quitting. Do not be discouraged if you start smoking again. Remember, most people try several times before they finally quit.   You may have symptoms of withdrawal because your body is used to nicotine. You may crave cigarettes, be irritable, feel very hungry, cough often, get headaches, or have difficulty concentrating.   The withdrawal symptoms are only temporary. They are strongest when you first quit, but they will go away within 10 to 14 days.  Here are some difficult situations to watch for:  Alcohol. Avoid drinking alcohol. Drinking lowers your chances of successfully quitting.   Caffeine. Try to reduce the amount of caffeine you consume. It also lowers your chances of successfully quitting.   Other smokers. Being around smoking can make you want to smoke. Avoid smokers.   Weight gain. Many smokers will gain  weight when they quit, usually less than 10 pounds. Eat a healthy diet and stay active. Do not let weight gain distract you from your main goal, quitting smoking. Some medicines that help you quit smoking may also help delay weight gain. You can always lose the weight gained after you quit.   Bad mood or depression. There are a lot of ways to improve your mood other than smoking.  If you are having problems with any of these situations, talk to your caregiver. SPECIAL SITUATIONS AND CONDITIONS Studies suggest that everyone can quit smoking. Your situation or condition can give you a special reason to quit.  Pregnant women/new mothers: By quitting, you protect your baby's health and your own.   Hospitalized patients: By quitting, you reduce health problems and help healing.   Heart attack patients: By quitting, you reduce your risk of a second heart attack.   Lung, head, and neck cancer patients: By quitting, you reduce your chance of a second cancer.   Parents of children and adolescents: By quitting, you protect your children from illnesses caused by secondhand smoke.  QUESTIONS TO THINK ABOUT Think about the following questions before you try to stop smoking. You may want to talk about your answers with your caregiver.  Why do you want to quit?   If you tried to quit in the past, what helped and what did not?   What will be the most difficult situations for you after you quit? How will you plan to handle them?   Who can help you through the tough times? Your family? Friends? Caregiver?   What pleasures do you get from smoking? What ways can you still get pleasure if you quit?  Here are some questions to ask your caregiver:  How can you help me to be successful at quitting?   What medicine do you think would be best for me and how should I take it?   What should I do if I need more help?   What is smoking withdrawal like? How can I get information on withdrawal?  Quitting takes  hard work and a lot of effort, but you can quit smoking. FOR MORE INFORMATION  Smokefree.gov (http://www.davis-sullivan.com/) provides free, accurate, evidence-based information and professional assistance to help support the immediate and long-term needs of people trying to quit smoking. Document Released: 08/29/2001 Document Revised: 08/24/2011 Document Reviewed: 06/21/2009 [facility] Patient Information 2012 [place], [place].

## 2011-12-18 NOTE — Progress Notes (Signed)
Lisa Schaefer [id number] Chief Complaint  Patient presents with  . Results     None     SUBJECTIVE  HPI: This 36 year old G2 P2 underwent endometrial biopsy on 11/15/2011 due to having some menstrual irregularities. She describes that she is scheduled to her menses 1-2 times a year but that for the last 6 months her menses have been regular. LMP 12/09/2011. Her last Pap was in September and was normal.  Past Medical History  Diagnosis Date  . PID (acute pelvic inflammatory disease) 2009   No past surgical history on file. History   Social History  . Marital Status: Single    Spouse Name: N/A    Number of Children: N/A  . Years of Education: N/A   Occupational History  . Not on file.   Social History Main Topics  . Smoking status: Current Everyday Smoker -- 0.5 packs/day for 18 years    Types: Cigarettes  . Smokeless tobacco: Never Used  . Alcohol Use: 0.0 oz/week     2-3 x month  . Drug Use: No  . Sexually Active: Yes -- Female partner(s)    Birth Control/ Protection: Condom   Other Topics Concern  . Not on file   Social History Narrative  . No narrative on file   No current outpatient prescriptions on file prior to visit.   No Known Allergies  ROS: Pertinent items in HPI  OBJECTIVE Blood pressure 139/94, pulse 77, temperature 97.4 F (36.3 C), temperature source Oral, height 5\' 10"  (1.778 m), weight 240 lb 11.2 oz (109.181 kg), last menstrual period 12/09/2011. GENERAL: Well-developed, well-nourished female in no acute distress.    Physical exam deferred LAB RESULTS Path result from endometrial biopsy was normal and showed no hyperplasia     ASSESSMENT Irregular menses Borderline BP elevation  PLAN Discussed reasons for menstrual irregularity including stress. Reassured reviewed regarding results. Advised to keep a menstrual calendar. Advised to stop smoking. Return here for annual exam and Pap if indicated in 6 months. F/U BP with  PMD.

## 2012-09-23 ENCOUNTER — Ambulatory Visit: Payer: Medicaid Other | Admitting: Obstetrics and Gynecology

## 2013-03-19 ENCOUNTER — Telehealth: Payer: Self-pay | Admitting: *Deleted

## 2013-03-19 NOTE — Telephone Encounter (Addendum)
Pt left message stating that she has been having abnormal bleeding. She had no period for several months and then started bleeding on 02/26/13. She has not stopped bleeding since then and has been also having cramping and passing clots. She wants to know if she needs a clinic appt.  I called pt and discussed her concern. She states that the bleeding ranges from just spotting to sometimes heavy bleeding and when she uses a tampon, she notices small clots on it. I explained that these small clots are normal, especially since she did not have a period for several months. I told pt that she could either wait to see what happens with the bleeding or we could schedule an appt, however this is not an urgent medical situation. Pt voiced understanding and stated that she will just wait for now. She has appt tomorrow with her PCP and will discuss with that MD.

## 2013-06-13 ENCOUNTER — Ambulatory Visit: Payer: Medicaid Other | Admitting: Dietician

## 2013-07-17 ENCOUNTER — Ambulatory Visit: Payer: Medicaid Other | Admitting: *Deleted

## 2014-07-20 ENCOUNTER — Encounter: Payer: Self-pay | Admitting: Obstetrics and Gynecology

## 2019-12-26 ENCOUNTER — Other Ambulatory Visit: Payer: Self-pay

## 2019-12-26 ENCOUNTER — Encounter (HOSPITAL_COMMUNITY): Payer: Self-pay | Admitting: Internal Medicine

## 2019-12-26 ENCOUNTER — Emergency Department (HOSPITAL_COMMUNITY): Payer: Self-pay

## 2019-12-26 ENCOUNTER — Inpatient Hospital Stay (HOSPITAL_COMMUNITY)
Admission: EM | Admit: 2019-12-26 | Discharge: 2019-12-28 | DRG: 069 | Disposition: A | Discharge status: Home / Self Care | Payer: Self-pay | Attending: Internal Medicine | Admitting: Internal Medicine

## 2019-12-26 ENCOUNTER — Observation Stay (HOSPITAL_COMMUNITY): Payer: Self-pay

## 2019-12-26 DIAGNOSIS — Z79899 Other long term (current) drug therapy: Secondary | ICD-10-CM

## 2019-12-26 DIAGNOSIS — I6340 Cerebral infarction due to embolism of unspecified cerebral artery: Secondary | ICD-10-CM

## 2019-12-26 DIAGNOSIS — E669 Obesity, unspecified: Secondary | ICD-10-CM | POA: Diagnosis present

## 2019-12-26 DIAGNOSIS — N179 Acute kidney failure, unspecified: Secondary | ICD-10-CM | POA: Diagnosis present

## 2019-12-26 DIAGNOSIS — F17210 Nicotine dependence, cigarettes, uncomplicated: Secondary | ICD-10-CM | POA: Diagnosis present

## 2019-12-26 DIAGNOSIS — Z823 Family history of stroke: Secondary | ICD-10-CM

## 2019-12-26 DIAGNOSIS — I110 Hypertensive heart disease with heart failure: Secondary | ICD-10-CM | POA: Diagnosis present

## 2019-12-26 DIAGNOSIS — F17211 Nicotine dependence, cigarettes, in remission: Secondary | ICD-10-CM

## 2019-12-26 DIAGNOSIS — I4819 Other persistent atrial fibrillation: Secondary | ICD-10-CM | POA: Diagnosis present

## 2019-12-26 DIAGNOSIS — I639 Cerebral infarction, unspecified: Secondary | ICD-10-CM

## 2019-12-26 DIAGNOSIS — R002 Palpitations: Secondary | ICD-10-CM

## 2019-12-26 DIAGNOSIS — Z833 Family history of diabetes mellitus: Secondary | ICD-10-CM

## 2019-12-26 DIAGNOSIS — D751 Secondary polycythemia: Secondary | ICD-10-CM | POA: Diagnosis present

## 2019-12-26 DIAGNOSIS — Z8249 Family history of ischemic heart disease and other diseases of the circulatory system: Secondary | ICD-10-CM

## 2019-12-26 DIAGNOSIS — I483 Typical atrial flutter: Secondary | ICD-10-CM | POA: Diagnosis present

## 2019-12-26 DIAGNOSIS — F329 Major depressive disorder, single episode, unspecified: Secondary | ICD-10-CM | POA: Diagnosis present

## 2019-12-26 DIAGNOSIS — G459 Transient cerebral ischemic attack, unspecified: Principal | ICD-10-CM | POA: Diagnosis present

## 2019-12-26 DIAGNOSIS — I959 Hypotension, unspecified: Secondary | ICD-10-CM | POA: Diagnosis present

## 2019-12-26 DIAGNOSIS — I5022 Chronic systolic (congestive) heart failure: Secondary | ICD-10-CM | POA: Diagnosis present

## 2019-12-26 DIAGNOSIS — I6389 Other cerebral infarction: Secondary | ICD-10-CM

## 2019-12-26 DIAGNOSIS — I5020 Unspecified systolic (congestive) heart failure: Secondary | ICD-10-CM

## 2019-12-26 DIAGNOSIS — Z8261 Family history of arthritis: Secondary | ICD-10-CM

## 2019-12-26 DIAGNOSIS — R29700 NIHSS score 0: Secondary | ICD-10-CM | POA: Diagnosis present

## 2019-12-26 DIAGNOSIS — Z20822 Contact with and (suspected) exposure to covid-19: Secondary | ICD-10-CM | POA: Diagnosis present

## 2019-12-26 DIAGNOSIS — Z7901 Long term (current) use of anticoagulants: Secondary | ICD-10-CM

## 2019-12-26 DIAGNOSIS — I4892 Unspecified atrial flutter: Secondary | ICD-10-CM | POA: Diagnosis present

## 2019-12-26 DIAGNOSIS — G8324 Monoplegia of upper limb affecting left nondominant side: Secondary | ICD-10-CM

## 2019-12-26 DIAGNOSIS — E785 Hyperlipidemia, unspecified: Secondary | ICD-10-CM | POA: Diagnosis present

## 2019-12-26 HISTORY — DX: Chronic systolic (congestive) heart failure: I50.22

## 2019-12-26 HISTORY — DX: Obesity, unspecified: E66.9

## 2019-12-26 HISTORY — DX: Other persistent atrial fibrillation: I48.19

## 2019-12-26 HISTORY — DX: Cerebral infarction, unspecified: I63.9

## 2019-12-26 LAB — CBC
HCT: 54.6 % — ABNORMAL HIGH (ref 36.0–46.0)
Hemoglobin: 17.6 g/dL — ABNORMAL HIGH (ref 12.0–15.0)
MCH: 29.6 pg (ref 26.0–34.0)
MCHC: 32.2 g/dL (ref 30.0–36.0)
MCV: 91.8 fL (ref 80.0–100.0)
Platelets: 368 10*3/uL (ref 150–400)
RBC: 5.95 MIL/uL — ABNORMAL HIGH (ref 3.87–5.11)
RDW: 13.5 % (ref 11.5–15.5)
WBC: 6.5 10*3/uL (ref 4.0–10.5)
nRBC: 0 % (ref 0.0–0.2)

## 2019-12-26 LAB — TROPONIN I (HIGH SENSITIVITY)
Troponin I (High Sensitivity): 5 ng/L (ref <18)
Troponin I (High Sensitivity): 5 ng/L (ref <18)

## 2019-12-26 LAB — BASIC METABOLIC PANEL
Anion gap: 13 (ref 5–15)
BUN: 18 mg/dL (ref 6–20)
CO2: 23 mmol/L (ref 22–32)
Calcium: 9.5 mg/dL (ref 8.9–10.3)
Chloride: 105 mmol/L (ref 98–111)
Creatinine, Ser: 1.12 mg/dL — ABNORMAL HIGH (ref 0.44–1.00)
GFR calc Af Amer: >60 mL/min (ref >60)
GFR calc non Af Amer: 59 mL/min — ABNORMAL LOW (ref >60)
Glucose, Bld: 116 mg/dL — ABNORMAL HIGH (ref 70–99)
Potassium: 4.4 mmol/L (ref 3.5–5.1)
Sodium: 141 mmol/L (ref 135–145)

## 2019-12-26 LAB — PROTIME-INR
INR: 1.2 (ref 0.8–1.2)
Prothrombin Time: 14.7 seconds (ref 11.4–15.2)

## 2019-12-26 LAB — ECHOCARDIOGRAM COMPLETE

## 2019-12-26 LAB — SARS CORONAVIRUS 2 (TAT 6-24 HRS): SARS Coronavirus 2: NEGATIVE

## 2019-12-26 MED ORDER — FUROSEMIDE 40 MG PO TABS
40.0000 mg | ORAL_TABLET | Freq: Every day | ORAL | Status: DC
  Administered 2019-12-27 – 2019-12-28 (×2): 40 mg via ORAL
  Filled 2019-12-26: qty 2
  Filled 2019-12-26 (×2): qty 1

## 2019-12-26 MED ORDER — ACETAMINOPHEN 160 MG/5ML PO SOLN: 650.0000 mg | ORAL | Status: DC | PRN

## 2019-12-26 MED ORDER — SACUBITRIL-VALSARTAN 24-26 MG PO TABS
1.0000 | ORAL_TABLET | Freq: Two times a day (BID) | ORAL | Status: DC
  Administered 2019-12-26 – 2019-12-28 (×2): 1 via ORAL
  Filled 2019-12-26 (×5): qty 1

## 2019-12-26 MED ORDER — SACUBITRIL-VALSARTAN 24-26 MG PO TABS
1.0000 | ORAL_TABLET | Freq: Two times a day (BID) | ORAL | Status: DC
  Filled 2019-12-26 (×2): qty 1

## 2019-12-26 MED ORDER — AMIODARONE HCL 200 MG PO TABS
200.0000 mg | ORAL_TABLET | Freq: Two times a day (BID) | ORAL | Status: DC
  Administered 2019-12-26 – 2019-12-28 (×4): 200 mg via ORAL
  Filled 2019-12-26 (×5): qty 1

## 2019-12-26 MED ORDER — SACUBITRIL-VALSARTAN 24-26 MG PO TABS: 1.0000 | ORAL_TABLET | Freq: Two times a day (BID) | ORAL | Status: DC

## 2019-12-26 MED ORDER — APIXABAN 5 MG PO TABS
5.0000 mg | ORAL_TABLET | Freq: Two times a day (BID) | ORAL | Status: DC
  Administered 2019-12-26 – 2019-12-28 (×4): 5 mg via ORAL
  Filled 2019-12-26 (×4): qty 1

## 2019-12-26 MED ORDER — METOPROLOL SUCCINATE ER 50 MG PO TB24
50.0000 mg | ORAL_TABLET | Freq: Two times a day (BID) | ORAL | Status: DC
  Administered 2019-12-26 – 2019-12-28 (×2): 50 mg via ORAL
  Filled 2019-12-26 (×4): qty 1

## 2019-12-26 MED ORDER — SPIRONOLACTONE 25 MG PO TABS
25.0000 mg | ORAL_TABLET | Freq: Every day | ORAL | Status: DC
  Administered 2019-12-27 – 2019-12-28 (×2): 25 mg via ORAL
  Filled 2019-12-26 (×3): qty 1

## 2019-12-26 MED ORDER — ACETAMINOPHEN 650 MG RE SUPP: 650.0000 mg | RECTAL | Status: DC | PRN

## 2019-12-26 MED ORDER — STROKE: EARLY STAGES OF RECOVERY BOOK
Freq: Once | Status: AC
  Filled 2019-12-26: qty 1

## 2019-12-26 MED ORDER — SENNOSIDES-DOCUSATE SODIUM 8.6-50 MG PO TABS: 1.0000 | ORAL_TABLET | Freq: Every evening | ORAL | Status: DC | PRN

## 2019-12-26 MED ORDER — FLUOXETINE HCL 20 MG PO CAPS
20.0000 mg | ORAL_CAPSULE | Freq: Every day | ORAL | Status: DC
  Administered 2019-12-27 – 2019-12-28 (×2): 20 mg via ORAL
  Filled 2019-12-26 (×3): qty 1

## 2019-12-26 MED ORDER — ACETAMINOPHEN 325 MG PO TABS
650.0000 mg | ORAL_TABLET | ORAL | Status: DC | PRN
  Administered 2019-12-27: 650 mg via ORAL
  Filled 2019-12-26: qty 2

## 2019-12-26 MED ORDER — SODIUM CHLORIDE 0.9% FLUSH: 3.0000 mL | Freq: Once | INTRAVENOUS | Status: DC

## 2019-12-26 NOTE — H&P (Addendum)
Date: 12/26/2019               Patient Name:  Lisa Schaefer MRN: [id number]  DOB: 1975-10-02 Age / Sex: 44 y.o., female   PCP: Patient, No Pcp Per              Medical Service: Internal Medicine Teaching Service              Attending Physician: Dr. Velna Ochs, MD    First Contact: Mikael Spray, MS  Pager: [phone]  Second Contact: Dr. Marva Panda Pager: [phone]  Third Contact Dr. Sydnee Levans Pager: [phone]       After Hours (After 5p/  First Contact Pager: [phone]  weekends / holidays): Second Contact Pager: [phone]   Chief Complaint: chest pain and feeling of heart racing  History of Present Illness:  Lisa Schaefer is a 44 year old female with a recent diagnosis of HFrEF (EF 15-20%) and atrial flutter s/p failed cardioversionx2 presenting with chest pain and palpitations. She notes that she "felt weird" yesterday for which she called EMS. She notes feeling her heart racing and also had tingling sensation in fingers and lower extremities. She notes that this was intermittent in nature. She notes dietary and medication compliance and denies any caffeine use. She denies any headache, vision changes, chest pain or shortness of breath, lower extremity swelling, orthopnea.  Has been compliant with fluid restriction (3-4 8oz bottles of water), and notes that she probably isn't urinating as much as she should. She does note recently using cream for yeast infection.  She notes some left chest pressure that has been present since being in the hospital that is non-radiating, non-reproducible. Has not gotten worse She believes she had COVID-19 in February 2020, but never got tested. She said that she had went to a college basketball game. No PCP.   Meds:  Current Meds  Medication Sig  . amiodarone (PACERONE) 200 MG tablet Take 200 mg by mouth 2 (two) times daily.  Marland Kitchen apixaban (ELIQUIS) 5 MG TABS tablet Take 5 mg by mouth 2 (two) times daily.  Marland Kitchen ENTRESTO 24-26 MG Take 1 tablet by mouth 2  (two) times daily.  Marland Kitchen FLUoxetine (PROZAC) 20 MG capsule Take 20 mg by mouth daily.  . furosemide (LASIX) 40 MG tablet Take 40 mg by mouth daily.  . metoprolol succinate (TOPROL-XL) 50 MG 24 hr tablet Take 50 mg by mouth 2 (two) times daily.  Marland Kitchen spironolactone (ALDACTONE) 25 MG tablet Take 25 mg by mouth daily.   Allergies: Allergies as of 12/26/2019  . (No Known Allergies)   Past Medical History:  Diagnosis Date  . Chronic systolic (congestive) heart failure ([place])   . Obesity   . Persistent atrial fibrillation ([place])   . PID (acute pelvic inflammatory disease) 2009    Family History: Mother - heart disease No family history of stroke.   Social History:  Patient has been staying with her mother. She notes that she is following a healthy diet with fruits and vegetables. She has been a little bit more active since getting out of the hospital. She denies any current tobacco use. She reports smoking <1/2ppd for ~20 years; quit smoking 4-6 weeks ago. She reports history of heavy alcohol use but occasional alcohol use currently. History of marijuana use. No current illicit drug use.   Review of Systems: Review of Systems  Constitutional: Negative for chills and fever.  Eyes: Negative for blurred vision.  Respiratory: Negative for shortness of breath.  Cardiovascular: Positive for palpitations. Negative for orthopnea and leg swelling.  Gastrointestinal: Negative for abdominal pain.  Genitourinary: Positive for dysuria.  Neurological: Positive for tingling. Negative for dizziness, focal weakness, weakness and headaches.   Physical Exam: Blood pressure 101/70, pulse 67, temperature 97.8 F (36.6 C), temperature source Oral, resp. rate 18, last menstrual period 12/09/2011, SpO2 98 %.  Physical Exam  Constitutional: Vital signs are normal.  Neck: No JVD present.  Cardiovascular: Normal rate, normal heart sounds and intact distal pulses. An irregular rhythm present.  No JVD. No  peripheral edema.   Respiratory: Effort normal and breath sounds normal. No respiratory distress. She has no wheezes. She has no rales. She exhibits no tenderness.  GI: Soft. Bowel sounds are normal. She exhibits no distension. There is no abdominal tenderness.  Neurological: She is alert. She has normal strength. No cranial nerve deficit or sensory deficit.  Skin: Skin is warm and dry.    CBC    Component Value Date/Time   WBC 6.5 12/26/2019 0555   RBC 5.95 (H) 12/26/2019 0555   HGB 17.6 (H) 12/26/2019 0555   HCT 54.6 (H) 12/26/2019 0555   PLT 368 12/26/2019 0555   MCV 91.8 12/26/2019 0555   MCH 29.6 12/26/2019 0555   MCHC 32.2 12/26/2019 0555   RDW 13.5 12/26/2019 0555   LYMPHSABS 1.6 02/09/2008 0536   MONOABS 0.9 02/09/2008 0536   EOSABS 0.1 02/09/2008 0536   BASOSABS 0.0 02/09/2008 0536   BMP Latest Ref Rng & Units 12/26/2019 02/08/2008  Glucose 70 - 99 mg/dL 244(W) 102(V)  BUN 6 - 20 mg/dL 18 6  Creatinine 2.53 - 1.00 mg/dL 6.64(Q) 0.34  Sodium 742 - 145 mmol/L 141 136  Potassium 3.5 - 5.1 mmol/L 4.4 3.3(L)  Chloride 98 - 111 mmol/L 105 102  CO2 22 - 32 mmol/L 23 25  Calcium 8.9 - 10.3 mg/dL 9.5 8.9    Ref Range & Units 08:23 05:55  Troponin I (High Sensitivity) <18 ng/L 5  5 CM    Prothrombin Time 11.4 - 15.2 seconds 14.7   INR 0.8 - 1.2 1.2    EKG: personally reviewed my interpretation is atrial flutter, irregularly irregular, LVH.  CXR: personally reviewed my interpretation is slightly enlarged cardiac silhouette, but otherwise unchanged from previous x-ray in 2010.   MR BRAIN WO CONTRAST:  IMPRESSION: Question 2 punctate foci of restricted diffusion at both frontoparietal vertex regions. These are tiny and, being at the periphery of the brain, could potentially be artifactual. However, my suspicion is that these could represent tiny micro embolic infarctions.  Assessment & Plan by Problem: Active Problems:   CVA (cerebral vascular accident) [facility])  Summary:    Lisa Schaefer is a 44 year old female with a past medical history of recently diagnosed atrial flutter and HFrEF (EF= 15-20%) who presented with feelings of heart racing and tingling in her left arm that has since resolved and is being admitted for further work up of CVA.   CVA: TIA? Vs embolic CVA? Ms. Mapel presents today with feelings of heart palpitations and tingling in her left arm that lasted a little over an hour. She also has had tingling in her feet that has since resolved. In the ED she had an MRI that was positive for possible embolic bilateral infarctions at the frontoparietal vertex regions. Given her recent history of HFrEF and atrial flutter there is high concern for small embolic CVA. She does not have any history of hyperlipidemia or CAD making  thrombotic CVA highly unlikely.  On exam though her neuro exam was entirely unremarkable. There is high probability of TIA. Neurology consulted; we greatly appreciate all recommendations.  - swallow study  - echocardiogram  - HbA1c - lipid panel  - follow CBC -Appreciate neurology recommendation  Atrial flutter and CHF:  Patient was recently diagnosed with CHF and atrial flutter. She has no symptoms of fluid overload at the moment and no shortness of breath or orthopnea. Cardiology consulted; greatly appreciate all recommendations.  - continue amiodarone 200 mg BID - continue apixaban 5 mg BID - continue entresto 24-26 BID  - continue furosemide 40 mg BID - continue metoprolol 50 mg BID -Cardiology/EP consulted. Appreciate recommendations  AKI: Mild The patient has a new AKI with creatinine of 1.12. Baseline appears to be 0.7-0.8 from data in care everywhere chart. She also has hypotension. Possibly due to dehydration.  - follow BMP - Will hold spironolactone 25 mg daily if no improvement  Depression: Patient has a history of depression, well controlled on fluoxetine.  - continue fluoxetine 20 mg daily  Dispo: Admit patient to  Observation with expected length of stay less than 2 midnights.  Signed: Laverna Peace, Medical Student 12/26/2019, 3:59 PM  Pager: @[phone]  Attestation for Student Documentation:  I personally was present and performed or re-performed the history, physical exam and medical decision-making activities of this service and have verified that the service and findings are accurately documented in the student's note.  -1314@, MD 12/26/2019, 7:58 PM

## 2019-12-26 NOTE — Hospital Course (Addendum)
Admitted 12/26/2019  Allergies: Patient has no known allergies. Pertinent Hx: HFrEF, new onset atrial flutter status (status post attempted cardioversion), on Eliquis, tobacco use  44 y.o. female p/w chest pain, numbness and tingling of left arm and left leg.  * TIA?:  Presented with left side weakness, Brain MRI with question 2 tiny microembolic infarctions (or artifact?). Concern for embolic stroke in setting of A flutter. Normal neuro exam on arrival Per ED provider note.  Neurology consulted. Rec pending.  *Chest pain and heart racing: Likely in setting of A flutter. Rate is controlled at 70s-90s. High sen Trop negative x 2. Hx of unsuccessful conversion. She is on Eliquis. Cards/EP consulted.   Consults: Cards/EP, Neurology  Meds: Amiodarone ,Eliquis, Entresto , fluoxetine, Lasix, metoprolol succinate , spironolactone VTE ppx: Eliquis IVF: none Diet: HH

## 2019-12-26 NOTE — ED Triage Notes (Addendum)
Pt c/o CP that began earlier this evening which has now evolved into numbess and tingling in the left arm. Just discharged this past week from [facility] (CHF and A-flutter). Pt given 324 asa pta.

## 2019-12-26 NOTE — Consult Note (Addendum)
Cardiology Consultation:   Patient ID: Lisa Schaefer; [id number]; 1976/02/28   Admit date: 12/26/2019 Date of Consult: 12/26/2019  Primary Care Provider: Patient, No Pcp Per Primary Cardiologist: Gwen Her, MD  Primary Electrophysiologist:  None   Patient Profile:   Lisa Schaefer is a 44 y.o. female with a hx of systolic CHF, persistent atrial fib, obesity, recent tob use, HTN, who is being seen today for the evaluation of Atrial fib at the request of Dr Antony Contras.  History of Present Illness:   Ms. Avants was admitted to [facility] from 03/26-03/31/2021 with rapid atrial flutter and CHF. EF 15-20%. TEE/DCCV attempted x 2, but pt did not remain in SR. She was started on amio 200 mg bid and Eliquis, plus metoprolol XL 50 mg bid, Entresto, Lasix 40 mg qd and spiro 25 mg qd. Wt on 03/30 was 281 lbs. UDS +only for THC.    After discharge, she went to her mother's house.  She has been compliant with her medications, and really trying to stick to a low-sodium diet.  She has not been able to weigh herself every day, but his weight several times since she is home and her weight was 271-273 pounds.  She has not had lower extremity edema. She denies orthopnea or PND.  She has had some dyspnea on exertion, but feels that she has been gradually increasing her activity since discharge.  She has felt better than she thought she would.  Last night, she noted her heart was going much faster than previous.  She started feeling a little short of breath.  She also had some discomfort and numbness in her left arm, but admits she was lying on that side.  Her symptoms resolved after an hour or so, but her heart rate remained elevated.  She then got chest pressure.  It reached a 4/10.  It a little more uncomfortable with deep inspiration.  She did not take anything specifically for it.  She became concerned about her symptoms, and came to the emergency room.  In the emergency room, her heart rate is well  controlled at times in the 70s.  However, she will have spikes that can be prolonged with a heart rate greater than 160 at times.  Rhythm is atrial flutter and when she has 2-1 conduction, her heart rate is significantly elevated.  Currently, her heart rate is controlled.  Her chest pain is a 1-2/10.  When the echocardiogram was being performed, and the technician was pressing on a site just to the left of her mid sternum, she said that worsened the pain.  With pressure on that site, it reproduces the pain she had at first, when it was a 4/10.    Past Medical History:  Diagnosis Date  . Chronic systolic (congestive) heart failure (HCC)   . Obesity   . Persistent atrial fibrillation (HCC)   . PID (acute pelvic inflammatory disease) 2009    No past surgical history on file.   Prior to Admission medications   Medication Sig Start Date End Date Taking? Authorizing Provider  amiodarone (PACERONE) 200 MG tablet Take 200 mg by mouth 2 (two) times daily. 12/17/19  Yes [provider]  apixaban (ELIQUIS) 5 MG TABS tablet Take 5 mg by mouth 2 (two) times daily. 12/17/19  Yes [provider]  ENTRESTO 24-26 MG Take 1 tablet by mouth 2 (two) times daily. 12/17/19  Yes [provider]  FLUoxetine (PROZAC) 20 MG capsule Take 20 mg by mouth  daily. 12/17/19  Yes [provider]  furosemide (LASIX) 40 MG tablet Take 40 mg by mouth daily. 12/17/19  Yes [provider]  metoprolol succinate (TOPROL-XL) 50 MG 24 hr tablet Take 50 mg by mouth 2 (two) times daily. 12/17/19  Yes [provider]  spironolactone (ALDACTONE) 25 MG tablet Take 25 mg by mouth daily. 12/17/19  Yes [provider]    Inpatient Medications: Scheduled Meds: .  stroke: mapping our early stages of recovery book   Does not apply Once  . amiodarone  200 mg Oral BID  . apixaban  5 mg Oral BID  . FLUoxetine  20 mg Oral Daily  . furosemide  40 mg Oral Daily  . metoprolol succinate   50 mg Oral BID  . sacubitril-valsartan  1 tablet Oral BID  . sodium chloride flush  3 mL Intravenous Once  . spironolactone  25 mg Oral Daily   Continuous Infusions:  PRN Meds: acetaminophen **OR** acetaminophen (TYLENOL) oral liquid 160 mg/5 mL **OR** acetaminophen, senna-docusate  Allergies:   No Known Allergies  Social History:   Social History   Socioeconomic History  . Marital status: Single    Spouse name: Not on file  . Number of children: Not on file  . Years of education: Not on file  . Highest education level: Not on file  Occupational History  . Not on file  Tobacco Use  . Smoking status: Current Every Day Smoker    Packs/day: 0.50    Years: 18.00    Pack years: 9.00    Types: Cigarettes  . Smokeless tobacco: Never Used  Substance and Sexual Activity  . Alcohol use: Yes    Comment: 2-3 x month  . Drug use: No  . Sexual activity: Yes    Partners: Male    Birth control/protection: Condom  Other Topics Concern  . Not on file  Social History Narrative  . Not on file   Social Determinants of Health   Financial Resource Strain:   . Difficulty of Paying Living Expenses:   Food Insecurity:   . Worried About Programme researcher, broadcasting/film/video in the Last Year:   . Barista in the Last Year:   Transportation Needs:   . Freight forwarder (Medical):   Marland Kitchen Lack of Transportation (Non-Medical):   Physical Activity:   . Days of Exercise per Week:   . Minutes of Exercise per Session:   Stress:   . Feeling of Stress :   Social Connections:   . Frequency of Communication with Friends and Family:   . Frequency of Social Gatherings with Friends and Family:   . Attends Religious Services:   . Active Member of Clubs or Organizations:   . Attends Banker Meetings:   Marland Kitchen Marital Status:   Intimate Partner Violence:   . Fear of Current or Ex-Partner:   . Emotionally Abused:   Marland Kitchen Physically Abused:   . Sexually Abused:     Family History:   Family History    Problem Relation Age of Onset  . Diabetes Mother   . Hypertension Father   . Cancer Father   . Arthritis Sister    Family Status:  Family Status  Relation Name Status  . Mother  Alive  . Father  Deceased  . Sister ##Sister1 Alive    ROS:  Please see the history of present illness.  All other ROS reviewed and negative.     Physical Exam/Data:  Vitals:   12/26/19 0545 12/26/19 0732  BP: (!) 146/88 101/70  Pulse: 95 67  Resp: 16 18  Temp: 97.6 F (36.4 C) 97.8 F (36.6 C)  TempSrc: Oral Oral  SpO2: 99% 98%   No intake or output data in the 24 hours ending 12/26/19 1532  Last 3 Weights 12/18/2011 11/15/2011 10/30/2011  Weight (lbs) 240 lb 11.2 oz 242 lb 1.6 oz 251 lb  Weight (kg) 109.181 kg 109.816 kg 113.853 kg     There is no height or weight on file to calculate BMI.   General:  Well nourished, well developed, female in no acute distress HEENT: normal Lymph: no adenopathy Neck: JVD -not seen elevated Endocrine:  No thryomegaly Vascular: No carotid bruits; 4/4 extremity pulses 2+  Cardiac:  normal S1, S2; widely irregular rate and rhythm; no murmur Lungs: Slightly decreased breath sounds bases but clear bilaterally, no wheezing, rhonchi or rales  Abd: soft, nontender, no hepatomegaly  Ext: no edema Musculoskeletal:  No deformities, BUE and BLE strength normal and equal Skin: warm and dry  Neuro:  CNs 2-12 intact, no focal abnormalities noted Psych:  Normal affect   EKG:  The EKG was personally reviewed and demonstrates: 4/9 at 1:56 PM, atrial flutter, variable conduction heart rate 68, no acute ischemic changes Telemetry:  Telemetry was personally reviewed and demonstrates: Atrial flutter with variable conduction, rate generally well controlled but will have spikes/periods of 2-1 conduction causing her heart rate to be greater than 150   CV studies:   ECHO: 12/26/2019, pending  TEE 12/14/2019 Left Ventricle: Moderately dilated left ventricle. Systolic  function is  severely abnormal. EF: 15-20%. . Left Atrium: There is no thrombus in the left atrial appendage. . Aortic Valve: There is trace regurgitation. . Mitral Valve: There is moderate regurgitation. . Tricuspid Valve: There is moderate regurgitation.  ECHO: 12/12/2019   Left atrial length medial-lateral (apical 4-chamber view) 6.110 cm  Left atrial length superior-inferior (apical 2-chamber view) 6.530 cm  LA Area Sys (A2C) 26.700 cm2  LA Area Sys (A4C) 27.300 cm2  LA ESV (A2C) 96.100 mL  LA volume 88.300 mL  LA volume 94.900 mL  Left atrial length anterior-posterior 4.300 cm  Left atrial length anterior-posterior 4.900 cm  Left Atrium Dimension 2D 4.600 cm  LA ESV Index (A4C) 36.200 ml/m2  LA Volume Index (BP) 38.9 mL/m2  LA/Ao Ratio 1.700 no units  LV Length Dias (A4C) 7.430 cm  LV Area Dias (A4C) 31.100 cm2  LVEDV 105.000 ml  LVEDV 161.096 ml  LV Diastolic Volume (BP) 045.409 mL  LV Length Sys (A4C) 8.119 cm  LV Systolic Volume (BP) 14.782 mL  LVES V 95.621 mL  LV Systolic Volume (BP) 30.865 mL  IVSd 1.240 1 - 2.01 cm  IVS 1.170 0.6 - 1.1 cm  LVIDd 5.560 10.17 - 14.14 cm  LVIDD 5.49 3.5 - 6 cm  LVIDs 4.430 5.81 - 8.81 cm  Left Ventricular Outflow Tract Cross Section Area 20.000 mm  LVOT diameter 2.000 cm  LVOT mn grad 1.0 mmHg  LVOT peak VTI 10.200 cm  LVOT Mean Vel 48.800 cm/s  LVOT peak vel 0.813 m/s  AV LVOT peak gradient 3.000 mmHg  LVPWd 1.220 1.09 - 2.04 cm  LVPWD 1.230 cm  A4C EF 19 %  Left Ventricular EF by 2-D Biplane by Method of Disks 45.900 %  Left Ventricular EF by Teichholz Method 38.100 %  LVFS 2D 18.600 %  Interventricular Septum/Left Ventricular PWD by 2D 0.951 no units  LVOT area 3.14 cm2  Left ventricular stroke volume 75.700 mL  LVOT stroke volume 32.000 cm3  Left ventricular stroke volume 56.000 mL  RA Major 6.110 cm  Right Atrial Volume 64.700 mL  AORTIC VALVE CUSP SEPERATION 2.000 cm  Aortic valve mean velocity 0.824 m/s    AV mean gradient 3.000 mmHg  Aortic valve velocity time integral 0.124 m  Aortic valve mean velocity 0.824 m/s  AV mean gradient 3.000 mmHg  AV VMAX 1.030 m/s  Ao peak vel 1.030 m/s  AV peak gradient 4.000 mmHg  AVA (VTI) 2.580 cm2  AV valve area 2.48 cm2  AVA/BSA 1.060 no units  Aortic root 2.700 cm  Ao root annulus 2.700 cm  MR Vel 406.000 cm/s  Mitral Regurgitant Peak Velocity 406.000 cm/s  MV DT 98.000 ms  E wave deceleration time 98.000 msec  MV PHT 29.000 ms  MV PHT 29.000 ms  MV E Vel 120.000 cm/s  MV Peak E Vel 120.000 cm/s  MV valve area p 1/2 method 7.59 cm2  PR EDV Vmax 130.000 cm/s  Pulmonary regurgitation late diastolic velocity 1.300 m/s  PR EDP PG 7.000 mmHg  RV AP4 Base 4.3 cm  RV AP4 Base 4.3 cm  Tricuspid valve peak regurgitation velocity 2.780 m/s  TR PG 24.000 mmHg  Left Ventricular EF 18 %  RAP 8.0 mmHg  TV MG 31.0 mmHg  ZIVSD -0.61   ZLVPWD -1.04   ZLVIDD -7.68   ZLVIDS -3.79   Result Impression  Left Ventricle: Mildly dilated left ventricle. Systolic function is  severely abnormal. EF: 15-20%. . Mitral Valve: There is moderate regurgitation. . Tricuspid Valve: There is moderate regurgitation. The right ventricular  systolic pressure is mildly elevated (37-49 mmHg).    Laboratory Data:   Chemistry Recent Labs  Lab 12/26/19 0555  NA 141  K 4.4  CL 105  CO2 23  GLUCOSE 116*  BUN 18  CREATININE 1.12*  CALCIUM 9.5  GFRNONAA 59*  GFRAA >60  ANIONGAP 13    Lab Results  Component Value Date   ALT 32 02/08/2008   AST 19 02/08/2008   ALKPHOS 82 02/08/2008   BILITOT 1.3 (H) 02/08/2008   Hematology Recent Labs  Lab 12/26/19 0555  WBC 6.5  RBC 5.95*  HGB 17.6*  HCT 54.6*  MCV 91.8  MCH 29.6  MCHC 32.2  RDW 13.5  PLT 368   Cardiac Enzymes High Sensitivity Troponin:   Recent Labs  Lab 12/26/19 0555 12/26/19 0823  TROPONINIHS 5 5      BNPNo results for input(s): BNP, PROBNP in the last 168 hours.   TSH:  Lab  Results  Component Value Date   TSH 1.811 09/20/2011   Lipids:No results found for: CHOL, HDL, LDLCALC, LDLDIRECT, TRIG, CHOLHDL HgbA1c:No results found for: HGBA1C Magnesium: No results found for: MG   Radiology/Studies:  DG Chest 2 View  Result Date: 12/26/2019 CLINICAL DATA:  Chest pressure. EXAM: CHEST - 2 VIEW COMPARISON:  Two-view chest x-ray a 06/16/2009 FINDINGS: The heart size is upper limits of normal. No edema or effusion is present to suggest failure. Scattered opacities at the lung bases likely reflect atelectasis. IMPRESSION: 1. Borderline cardiomegaly without failure. 2. Scattered opacities at the lung bases likely reflect atelectasis. Electronically Signed   By: Marin Roberts M.D.   On: 12/26/2019 06:08   MR BRAIN WO CONTRAST  Result Date: 12/26/2019 CLINICAL DATA:  Numbness and tingling of the left arm. EXAM: MRI HEAD WITHOUT CONTRAST TECHNIQUE: Multiplanar, multiecho  pulse sequences of the brain and surrounding structures were obtained without intravenous contrast. COMPARISON:  None. FINDINGS: Brain: No abnormality is seen affecting the brainstem or cerebellum. Cerebral hemispheres show a few small foci of T2 and FLAIR signal in the white matter consistent with minimal small vessel change. There is question of a punctate acute infarction at each frontoparietal vertex. These are tiny findings and, being at the periphery of the brain, could potentially be artifactual. However, my concern is that of micro embolic infarctions. No mass effect or hemorrhage. Hydrocephalus, mass lesion or extra-axial collection. Vascular: Major vessels at the base of the brain show flow. Skull and upper cervical spine: Negative Sinuses/Orbits: Clear/normal Other: None IMPRESSION: Question 2 punctate foci of restricted diffusion at both frontoparietal vertex regions. These are tiny and, being at the periphery of the brain, could potentially be artifactual. However, my suspicion is that these could  represent tiny micro embolic infarctions. Electronically Signed   By: Paulina Fusi M.D.   On: 12/26/2019 11:03    Assessment and Plan:   1.  Persistent atrial flutter -She has been compliant taking amiodarone 200 mg twice daily for over a week, since she was discharged on 3/31 -Review echocardiogram, but suspect she will benefit from sinus rhythm -She states she has not missed any doses of her Eliquis -However, with acute CVA, review with MD to decide if we would be able to do a cardioversion, with or without TEE -Consider EP eval, she may be a candidate for ablation of her typical atrial flutter  2.  Anticoagulation: -Continue Eliquis for CHA2DS2-VASc of (CHF, HTN, Female, CVA x 2)  3.  Chest pain -Her ECG is in atrial flutter, no ischemic changes noted -Cardiac enzymes are negative x2 -There is a pleuritic component to her pain, the pain may also be related to an elevated heart rate which she has had at times -Continue current medications  4.  Chronic systolic CHF -Patient volume status is good by exam -She is on good therapy with beta-blocker, Entresto, spironolactone, Lasix -Continue current medications  Otherwise, per IM  Active Problems:   CVA (cerebral vascular accident) [facility])  For questions or updates, please contact [facility] Please consult [email or web address] for contact info under Cardiology/STEMI.   Signed, Theodore Demark, PA-C  12/26/2019 3:32 PM  Patient seen, examined. Available data reviewed. Agree with findings, assessment, and plan as outlined by Theodore Demark, PA-C.  The patient is independently interviewed and examined.  On my exam, she is a pleasant young woman in no distress.  JVP is normal, carotid upstrokes are normal without bruits, heart is irregularly irregular without murmur gallop, lung fields are clear bilaterally, abdomen is soft and nontender with no organomegaly, extremities have no edema, skin is warm and dry with no rash, neurologic is grossly  intact with 5/5 strength in the arms and legs bilaterally.  Review of telemetry demonstrates atrial flutter with variable response, heart rate in the 70s.  The patient has been recently diagnosed with acute systolic heart failure and very severe LV dysfunction with LVEF 15 to 20%.  She was found to be rapid atrial flutter and cardioversion was attempted but unsuccessful.  She has been started on guideline directed medical therapy with metoprolol succinate, Entresto, furosemide, and spironolactone.  Her atrial flutter is controlled with amiodarone and she is anticoagulated with apixaban.  She is now admitted with left hand numbness and tingling and found to have 2 punctate foci in the region of the frontoparietal vertex.  The  patient's echocardiogram images are personally reviewed.  Her LVEF is estimated at 20 to 25%.  There is a question whether these lesions are artifactual or microembolic infarctions.  The patient reports good compliance with apixaban.  I do not recommend any changes in her medical regimen right now.  She has close outpatient follow-up arranged with Dr Wynonia HazardKhawaja in [place] on 4/13.  It might be reasonable to consider formal EP evaluation but I will defer this to her primary cardiologist.  Otherwise no changes are recommended today.  Please call with any further questions.  She is encouraged to continue her current medications.  Tonny BollmanMichael Takima Encina, M.D. 12/26/2019 5:26 PM

## 2019-12-26 NOTE — ED Provider Notes (Addendum)
[facility] EMERGENCY DEPARTMENT Provider Note   CSN: [id number] Arrival date & time: 12/26/19  0530     History Chief Complaint  Patient presents with  . Chest Pain    Lisa Schaefer is a 44 y.o. female.  HPI Patient presents with chest pain feeling her heart racing.  Recent admission to hospital in [place] with diagnosis of CHF and new onset atrial flutter.  Cardioversion attempted that time but would not remain in sinus rhythm.  She is on Eliquis.  Has been doing well since.  But states earlier today had heart racing but had numbness and tingling in her left arm and somewhat left leg with it.  States she had some episodes of left leg numbness before this.  No fevers.  No cough.  Chest pain is improving.  Quit smoking around a month ago.    Past Medical History:  Diagnosis Date  . PID (acute pelvic inflammatory disease) 2009  atrila flutter. CHF EF 15-20%  Patient Active Problem List   Diagnosis Date Noted  . Smoker 12/18/2011  . Menorrhagia 09/20/2011    No past surgical history on file.   OB History    Gravida  2   Para  2   Term  1   Preterm  1   AB      Living  2     SAB      TAB      Ectopic      Multiple      Live Births              Family History  Problem Relation Age of Onset  . Diabetes Mother   . Hypertension Father   . Cancer Father   . Arthritis Sister     Social History   Tobacco Use  . Smoking status: Current Every Day Smoker    Packs/day: 0.50    Years: 18.00    Pack years: 9.00    Types: Cigarettes  . Smokeless tobacco: Never Used  Substance Use Topics  . Alcohol use: Yes    Comment: 2-3 x month  . Drug use: No    Home Medications Prior to Admission medications   Medication Sig Start Date End Date Taking? Authorizing Provider  amiodarone (PACERONE) 200 MG tablet Take 200 mg by mouth 2 (two) times daily. 12/17/19  Yes [provider]  apixaban (ELIQUIS) 5 MG TABS tablet Take 5 mg  by mouth 2 (two) times daily. 12/17/19  Yes [provider]  ENTRESTO 24-26 MG Take 1 tablet by mouth 2 (two) times daily. 12/17/19  Yes [provider]  FLUoxetine (PROZAC) 20 MG capsule Take 20 mg by mouth daily. 12/17/19  Yes [provider]  furosemide (LASIX) 40 MG tablet Take 40 mg by mouth daily. 12/17/19  Yes [provider]  metoprolol succinate (TOPROL-XL) 50 MG 24 hr tablet Take 50 mg by mouth 2 (two) times daily. 12/17/19  Yes [provider]  spironolactone (ALDACTONE) 25 MG tablet Take 25 mg by mouth daily. 12/17/19  Yes [provider]    Allergies    Patient has no known allergies.  Review of Systems   Review of Systems  Constitutional: Negative for appetite change.  HENT: Positive for congestion.   Respiratory: Negative for shortness of breath.   Cardiovascular: Positive for chest pain and palpitations.  Gastrointestinal: Negative for abdominal pain.  Genitourinary: Negative for flank pain.  Musculoskeletal: Negative for back pain.  Neurological:  Positive for numbness. Negative for light-headedness and headaches.  Psychiatric/Behavioral: Negative for confusion.    Physical Exam Updated Vital Signs BP 101/70   Pulse 67   Temp 97.8 F (36.6 C) (Oral)   Resp 18   LMP 12/09/2011   SpO2 98%   Physical Exam Vitals and nursing note reviewed.  HENT:     Head: Normocephalic.  Eyes:     Extraocular Movements: Extraocular movements intact.     Pupils: Pupils are equal, round, and reactive to light.  Cardiovascular:     Rate and Rhythm: Normal rate. Rhythm irregular.  Pulmonary:     Breath sounds: No wheezing, rhonchi or rales.  Musculoskeletal:     Right lower leg: No edema.     Left lower leg: No edema.  Skin:    General: Skin is warm.     Capillary Refill: Capillary refill takes less than 2 seconds.  Neurological:     Comments: Face symmetric.  Eye movements intact.  Good strength bilaterally.  Awake and  appropriate.  Good sensation bilaterally also.     ED Results / Procedures / Treatments   Labs (all labs ordered are listed, but only abnormal results are displayed) Labs Reviewed  BASIC METABOLIC PANEL - Abnormal; Notable for the following components:      Result Value   Glucose, Bld 116 (*)    Creatinine, Ser 1.12 (*)    GFR calc non Af Amer 59 (*)    All other components within normal limits  CBC - Abnormal; Notable for the following components:   RBC 5.95 (*)    Hemoglobin 17.6 (*)    HCT 54.6 (*)    All other components within normal limits  PROTIME-INR  TROPONIN I (HIGH SENSITIVITY)  TROPONIN I (HIGH SENSITIVITY)    EKG EKG Interpretation  Date/Time:  Friday December 26 2019 05:28:49 EDT Ventricular Rate:  95 PR Interval:    QRS Duration: 88 QT Interval:  292 QTC Calculation: 366 R Axis:   72 Text Interpretation: Atrial flutter with variable A-V block Minimal voltage criteria for LVH, may be normal variant ( Sokolow-Lyon ) ST & T wave abnormality, consider lateral ischemia Abnormal ECG Confirmed by Davonna Belling [phone]) on 12/26/2019 8:31:47 AM   Radiology DG Chest 2 View  Result Date: 12/26/2019 CLINICAL DATA:  Chest pressure. EXAM: CHEST - 2 VIEW COMPARISON:  Two-view chest x-ray a 06/16/2009 FINDINGS: The heart size is upper limits of normal. No edema or effusion is present to suggest failure. Scattered opacities at the lung bases likely reflect atelectasis. IMPRESSION: 1. Borderline cardiomegaly without failure. 2. Scattered opacities at the lung bases likely reflect atelectasis. Electronically Signed   By: San Morelle M.D.   On: 12/26/2019 06:08   MR BRAIN WO CONTRAST  Result Date: 12/26/2019 CLINICAL DATA:  Numbness and tingling of the left arm. EXAM: MRI HEAD WITHOUT CONTRAST TECHNIQUE: Multiplanar, multiecho pulse sequences of the brain and surrounding structures were obtained without intravenous contrast. COMPARISON:  None. FINDINGS: Brain: No  abnormality is seen affecting the brainstem or cerebellum. Cerebral hemispheres show a few small foci of T2 and FLAIR signal in the white matter consistent with minimal small vessel change. There is question of a punctate acute infarction at each frontoparietal vertex. These are tiny findings and, being at the periphery of the brain, could potentially be artifactual. However, my concern is that of micro embolic infarctions. No mass effect or hemorrhage. Hydrocephalus, mass lesion or extra-axial collection. Vascular: Major vessels at the  base of the brain show flow. Skull and upper cervical spine: Negative Sinuses/Orbits: Clear/normal Other: None IMPRESSION: Question 2 punctate foci of restricted diffusion at both frontoparietal vertex regions. These are tiny and, being at the periphery of the brain, could potentially be artifactual. However, my suspicion is that these could represent tiny micro embolic infarctions. Electronically Signed   By: Paulina Fusi M.D.   On: 12/26/2019 11:03    Procedures Procedures (including critical care time)  Medications Ordered in ED Medications  sodium chloride flush (NS) 0.9 % injection 3 mL (has no administration in time range)    ED Course  I have reviewed the triage vital signs and the nursing notes.  Pertinent labs & imaging results that were available during my care of the patient were reviewed by me and considered in my medical decision making (see chart for details).    MDM Rules/Calculators/A&P                      Patient presented with chest pain palpitations and left arm and leg numbness.  Recently seen at [facility] diagnosed with CHF and atrial flutter.  EF of 15 to 20%.  EKG is in atrial flutter.  Symptoms improved on left side, however with atrial flutter worrisome for embolic cause potential stroke.  MRI done and showed potential findings.  Discussed with neurology.  With possible stroke feels the patient benefit from admission to the  hospital.  Patient is already on anticoagulation.  Will discuss with unassigned medicine.  CHA2DS2-VASc of 4 for female CHF and stroke. Final Clinical Impression(s) / ED Diagnoses Final diagnoses:  Atrial flutter, unspecified type [facility])  Cerebrovascular accident (CVA), unspecified mechanism [facility])    Rx / DC Orders ED Discharge Orders    None       Benjiman Core, MD 12/26/19 1233    Benjiman Core, MD 12/26/19 1341

## 2019-12-26 NOTE — Progress Notes (Signed)
  Echocardiogram 2D Echocardiogram has been performed.  Gerda Diss 12/26/2019, 4:38 PM

## 2019-12-26 NOTE — Consult Note (Signed)
NEURO HOSPITALIST CONSULT NOTE   Requesting Physician: Dr. Rubin Payor    Chief Complaint: Stroke  History obtained from:  Patient   HPI:                                                                                                                                         Lisa Schaefer is an 44 y.o. female that presented with chest pain and mild left side numbness. She was recently treated in [place] for rapid atrial flutter and CHF. She was started on several medications at that time, including Amiodarone and Eliquis. She has been staying since that time with her mother in [place] and focusing on a low sodium diet. Upon arrival in the ED she was in atrial flutter complaining of chest pain and numbness of her left arm and leg. The ED provider was concerned the numbness could be a stroke given her recent diagnosis of atrial flutter.   Brain MRI Impression: Question 2 punctate foci of restricted diffusion at both frontoparietal vertex regions. These are tiny and, being at the periphery of the brain, could potentially be artifactual. However, my suspicion is that these could represent tiny micro embolic Infarctions.  Cardiology was consulted for treatment of her chest pain and atrial flutter and neurology consulted for potential stroke.   In discussion with the patient she reports intermittent numbness in her left arm and leg, most noticeable in her upper arm and upper thigh started yesterday. She denied any numbness in her face. She denied any associated weakness. She initial stated the numbness had completely resolved, however upon examination with light touch she thought the sensation in both her left arm and left leg was very slightly less when compared to the right.   Date last known well: 12/25/19  tPA Given: No. Outside tPA window.  Modified Rankin: 0  Past Medical History:  Diagnosis Date  . Chronic systolic (congestive) heart failure (HCC)   . Obesity   .  Persistent atrial fibrillation (HCC)   . PID (acute pelvic inflammatory disease) 2009    No past surgical history on file.  Family History  Problem Relation Age of Onset  . Diabetes Mother   . Hypertension Father   . Cancer Father   . Arthritis Sister          Social History:  reports that she has been smoking cigarettes. She has a 9.00 pack-year smoking history. She has never used smokeless tobacco. She reports current alcohol use. She reports that she does not use drugs.  Allergies: No Known Allergies  Medications:  I have reviewed the patient's current medications.  ROS:                                                                                                                                       History obtained from the patient  General ROS: negative for - chills, fatigue, fever, night sweats, weight gain or weight loss Psychological ROS: negative for - , hallucinations, memory difficulties, mood swings Ophthalmic ROS: negative for - blurry vision, double vision, eye pain or loss of vision ENT ROS: negative for - epistaxis, oral lesions, sore throat, tinnitus or vertigo Respiratory ROS: negative for - cough,  shortness of breath or wheezing. Reports runny nose, which she states is not unusual with her allergies.  Cardiovascular ROS: recent chest pain, which is primarily what brought her into hospital this admission. See HPI. Denies chest pain at the time of assessment.  Gastrointestinal ROS: negative for - abdominal pain, diarrhea,  nausea/vomiting or stool incontinence Genito-Urinary ROS: negative for - dysuria, hematuria, incontinence or urinary frequency/urgency Musculoskeletal ROS: negative for - joint swelling or muscular weakness Neurological ROS: as noted in HPI   General Examination:                                                                                                       Blood pressure 124/63, pulse 80, temperature 97.8 F (36.6 C), temperature source Oral, resp. rate (!) 21, last menstrual period 12/09/2011, SpO2 97 %.  Physical Exam  Constitutional: Appears well-developed and well-nourished.  Psych: Affect appropriate to situation Eyes: Normal external eye and conjunctiva. HENT: Normocephalic, no lesions, without obvious abnormality.   Musculoskeletal-no joint tenderness, deformity or swelling Cardiovascular: bedside telemetry for abnormal rhythm, atrial flutter Respiratory: Effort normal, non-labored breathing saturations WNL GI: Soft.  No distension. There is no tenderness.  Skin: WDI  Neurological Examination Mental Status: Alert, oriented, thought content appropriate.  Speech fluent without evidence of aphasia.  Able to follow commands without difficulty. Cranial Nerves: II:  Visual fields grossly normal,  III,IV, VI: ptosis not present, extra-ocular motions intact bilaterally,  V,VII: smile symmetric, facial light touch sensation normal bilaterally VIII: hearing normal bilaterally IX,X: uvula rises symmetrically XI: bilateral shoulder shrug XII: midline tongue extension Motor: Right : Upper extremity   5/5    Left:     Upper extremity   5/5  Lower extremity   5/5     Lower extremity   5/5 Tone and bulk:normal tone throughout; no atrophy noted Sensory:  Patient asked to retest multiple times on both upper and lower extremities, unsure if they were the same. She reports left arm and leg are slightly decreased in sensation  Cerebellar: normal finger-to-nose, normal rapid alternating movements and normal heel-to-shin test Gait: normal gait and station   Lab Results: Basic Metabolic Panel: Recent Labs  Lab 12/26/19 0555  NA 141  K 4.4  CL 105  CO2 23  GLUCOSE 116*  BUN 18  CREATININE 1.12*  CALCIUM 9.5    CBC: Recent Labs  Lab 12/26/19 0555  WBC 6.5  HGB 17.6*  HCT 54.6*  MCV 91.8  PLT  368    Lipid Panel: No results for input(s): CHOL, TRIG, HDL, CHOLHDL, VLDL, LDLCALC in the last 168 hours.  CBG: No results for input(s): GLUCAP in the last 168 hours.  Imaging: DG Chest 2 View  Result Date: 12/26/2019 CLINICAL DATA:  Chest pressure. EXAM: CHEST - 2 VIEW COMPARISON:  Two-view chest x-ray a 06/16/2009 FINDINGS: The heart size is upper limits of normal. No edema or effusion is present to suggest failure. Scattered opacities at the lung bases likely reflect atelectasis. IMPRESSION: 1. Borderline cardiomegaly without failure. 2. Scattered opacities at the lung bases likely reflect atelectasis. Electronically Signed   By: San Morelle M.D.   On: 12/26/2019 06:08   MR BRAIN WO CONTRAST  Result Date: 12/26/2019 CLINICAL DATA:  Numbness and tingling of the left arm. EXAM: MRI HEAD WITHOUT CONTRAST TECHNIQUE: Multiplanar, multiecho pulse sequences of the brain and surrounding structures were obtained without intravenous contrast. COMPARISON:  None. FINDINGS: Brain: No abnormality is seen affecting the brainstem or cerebellum. Cerebral hemispheres show a few small foci of T2 and FLAIR signal in the white matter consistent with minimal small vessel change. There is question of a punctate acute infarction at each frontoparietal vertex. These are tiny findings and, being at the periphery of the brain, could potentially be artifactual. However, my concern is that of micro embolic infarctions. No mass effect or hemorrhage. Hydrocephalus, mass lesion or extra-axial collection. Vascular: Major vessels at the base of the brain show flow. Skull and upper cervical spine: Negative Sinuses/Orbits: Clear/normal Other: None IMPRESSION: Question 2 punctate foci of restricted diffusion at both frontoparietal vertex regions. These are tiny and, being at the periphery of the brain, could potentially be artifactual. However, my suspicion is that these could represent tiny micro embolic infarctions.  Electronically Signed   By: Nelson Chimes M.D.   On: 12/26/2019 11:03   ECHOCARDIOGRAM COMPLETE  Result Date: 12/26/2019    ECHOCARDIOGRAM REPORT   Patient Name:   JODI CRISCUOLO Date of Exam: 12/26/2019 Medical Rec #:  [id number]      Height:       70.0 in Accession #:    [id number]     Weight:       240.7 lb Date of Birth:  April 19, 1976       BSA:          2.258 m Patient Age:    15 years       BP:           104/74 mmHg Patient Gender: F              HR:           64 bpm. Exam Location:  Inpatient Procedure: 2D Echo, Cardiac Doppler and Color Doppler Indications:    Stroke  History:        Patient has no prior history of  Echocardiogram examinations.                 CHF; Arrythmias:Atrial Fibrillation.  Sonographer:    Ross LudwigArthur Guy RDCS (AE) Referring Phys: [id number] [facility] Hospital[facility]ATH[facility] PICKERING  Sonographer Comments: Patient is morbidly obese. IMPRESSIONS  1. Left ventricular thrombus is not seen. Left ventricular ejection fraction, by estimation, is 20 to 25%. The left ventricle has severely decreased function. The left ventricle demonstrates global hypokinesis. The left ventricular internal cavity size was mildly dilated. Left ventricular diastolic function could not be evaluated.  2. Right ventricular systolic function is mildly reduced. The right ventricular size is normal. There is normal pulmonary artery systolic pressure. The estimated right ventricular systolic pressure is 20.5 mmHg.  3. The mitral valve is normal in structure. No evidence of mitral valve regurgitation.  4. The aortic valve is normal in structure. Aortic valve regurgitation is not visualized.  5. The inferior vena cava is normal in size with greater than 50% respiratory variability, suggesting right atrial pressure of 3 mmHg. Conclusion(s)/Recommendation(s): No clear evidence of left ventricular thrombus, but in the setting of stroke, repeat imaging with microbubble Definity contrast may be considered. FINDINGS  Left Ventricle: Left ventricular thrombus is not  seen. Left ventricular ejection fraction, by estimation, is 20 to 25%. The left ventricle has severely decreased function. The left ventricle demonstrates global hypokinesis. The left ventricular internal  cavity size was mildly dilated. There is no left ventricular hypertrophy. Left ventricular diastolic function could not be evaluated due to atrial fibrillation. Left ventricular diastolic function could not be evaluated. Right Ventricle: The right ventricular size is normal. No increase in right ventricular wall thickness. Right ventricular systolic function is mildly reduced. There is normal pulmonary artery systolic pressure. The tricuspid regurgitant velocity is 2.09 m/s, and with an assumed right atrial pressure of 3 mmHg, the estimated right ventricular systolic pressure is 20.5 mmHg. Left Atrium: Left atrial size was normal in size. Right Atrium: Right atrial size was normal in size. Pericardium: Trivial pericardial effusion is present. Mitral Valve: The mitral valve is normal in structure. No evidence of mitral valve regurgitation. Tricuspid Valve: The tricuspid valve is normal in structure. Tricuspid valve regurgitation is trivial. Aortic Valve: The aortic valve is normal in structure. Aortic valve regurgitation is not visualized. Aortic valve mean gradient measures 2.0 mmHg. Aortic valve peak gradient measures 4.0 mmHg. Aortic valve area, by VTI measures 2.22 cm. Pulmonic Valve: The pulmonic valve was grossly normal. Pulmonic valve regurgitation is not visualized. Aorta: The aortic root and ascending aorta are structurally normal, with no evidence of dilitation. Venous: The inferior vena cava is normal in size with greater than 50% respiratory variability, suggesting right atrial pressure of 3 mmHg. IAS/Shunts: No atrial level shunt detected by color flow Doppler.  LEFT VENTRICLE PLAX 2D LVIDd:         5.90 cm LVIDs:         5.10 cm LV PW:         1.09 cm LV IVS:        0.94 cm LVOT diam:     2.10 cm LV  SV:         39 LV SV Index:   17 LVOT Area:     3.46 cm  LV Volumes (MOD) LV vol d, MOD A2C: 76.1 ml LV vol d, MOD A4C: 113.0 ml LV vol s, MOD A2C: 57.6 ml LV vol s, MOD A4C: 85.6 ml LV SV MOD A2C:     18.5 ml  LV SV MOD A4C:     113.0 ml LV SV MOD BP:      21.7 ml RIGHT VENTRICLE            IVC RV S prime:     8.49 cm/s  IVC diam: 0.99 cm TAPSE (M-mode): 1.8 cm LEFT ATRIUM             Index       RIGHT ATRIUM           Index LA diam:        2.70 cm 1.20 cm/m  RA Area:     19.90 cm LA Vol (A2C):   48.7 ml 21.57 ml/m RA Volume:   50.30 ml  22.27 ml/m LA Vol (A4C):   29.2 ml 12.93 ml/m LA Biplane Vol: 37.4 ml 16.56 ml/m  AORTIC VALVE AV Area (Vmax):    2.29 cm AV Area (Vmean):   1.97 cm AV Area (VTI):     2.22 cm AV Vmax:           99.60 cm/s AV Vmean:          73.900 cm/s AV VTI:            0.175 m AV Peak Grad:      4.0 mmHg AV Mean Grad:      2.0 mmHg LVOT Vmax:         65.80 cm/s LVOT Vmean:        42.100 cm/s LVOT VTI:          0.112 m LVOT/AV VTI ratio: 0.64  AORTA Ao Root diam: 2.80 cm Ao Asc diam:  2.80 cm TRICUSPID VALVE TR Peak grad:   17.5 mmHg TR Vmax:        209.00 cm/s  SHUNTS Systemic VTI:  0.11 m Systemic Diam: 2.10 cm Mihai Croitoru MD Electronically signed by Thurmon Fair MD Signature Date/Time: 12/26/2019/5:26:33 PM    Final    Assessment: 44 y.o. female with a history of heart failure, atrial fibrillation, obesity and pelvic inflammatory disease. Recently treated unsuccessfully with cardioversion x2 in [place]. Now on Eliquis for anticoagulation. Admitted with chest pain and intermittent left arm and leg numbness. On examination very slight decrease in sensation of left arm and leg. MRI showed 2 punctate infarcts, both frontoparietal vertex regions.   Echocardiogram completed today with cardiology EF 20-25%. Cardiology has recommended no changes her medical regimen and to continue planned follow-up with her cardiologist on 4/13.  Stroke Risk Factors - Atrial Fibrillation,  Obesity  Recommendations: --HgbA1c, fasting lipid panel pending --Consider Carotid Doppler to evaluate as potential source of stroke, although embolic source felt most likely.   --Remains on Eliquis for atrial fibrillation/stroke prevention  --Telemetry monitoring --Frequent neuro checks --NPO until passes stroke swallow screen --Discussed benefits of healthy diet and continued smoking cessation (she reported that she quit smoking about 6 weeks ago).   Cathrine Muster, DNP, FNP-C [facility]   Plan discussed with attending neurologist, review of note to follow.   12/26/2019, 6:44 PM

## 2019-12-26 NOTE — Progress Notes (Addendum)
Seen and examined the patient, For detailed plan please refer to Lisa Schaefer note which will be available soon.  44 year old F past medical history of obesity, persistent atrial fibrillation with attempted cardioversion after 2 failed, on Eliquis, heart failure with EF 15 to 20%.  Presents to ED with chest palpitations-likely symptomatic A. fib but as well as paresthesias most notably in the left arm, MRI brain revealing punctate infarcts likely cardioembolic. Currently her exam is unremarkable for any focal findings, paresthesias have resolved.  It is unclear to me her paresthesias were due to embolic infarcts either.  I do think Eliquis is a good medication for A. Fib, however since she low ejection fraction-would pursue repeat echocardiogram to make sure patient does not have cardiac thrombus.  Also recommend cardiology consult to evaluate and optimize heart failure.  Also recommend obtaining vascular imaging complete stroke work-up.  Stroke team to follow and make further recommendations tomorrow.

## 2019-12-27 ENCOUNTER — Observation Stay (HOSPITAL_COMMUNITY): Payer: Self-pay

## 2019-12-27 DIAGNOSIS — I6340 Cerebral infarction due to embolism of unspecified cerebral artery: Secondary | ICD-10-CM

## 2019-12-27 DIAGNOSIS — G8194 Hemiplegia, unspecified affecting left nondominant side: Secondary | ICD-10-CM

## 2019-12-27 DIAGNOSIS — I4892 Unspecified atrial flutter: Secondary | ICD-10-CM | POA: Diagnosis present

## 2019-12-27 LAB — LIPID PANEL
Cholesterol: 167 mg/dL (ref 0–200)
HDL: 41 mg/dL (ref >40)
LDL Cholesterol: 102 mg/dL — ABNORMAL HIGH (ref 0–99)
Total CHOL/HDL Ratio: 4.1 RATIO
Triglycerides: 118 mg/dL (ref <150)
VLDL: 24 mg/dL (ref 0–40)

## 2019-12-27 LAB — CBC
HCT: 52.9 % — ABNORMAL HIGH (ref 36.0–46.0)
Hemoglobin: 16.9 g/dL — ABNORMAL HIGH (ref 12.0–15.0)
MCH: 29.6 pg (ref 26.0–34.0)
MCHC: 31.9 g/dL (ref 30.0–36.0)
MCV: 92.6 fL (ref 80.0–100.0)
Platelets: 353 10*3/uL (ref 150–400)
RBC: 5.71 MIL/uL — ABNORMAL HIGH (ref 3.87–5.11)
RDW: 13.5 % (ref 11.5–15.5)
WBC: 6.7 10*3/uL (ref 4.0–10.5)
nRBC: 0 % (ref 0.0–0.2)

## 2019-12-27 LAB — HIV ANTIBODY (ROUTINE TESTING W REFLEX): HIV Screen 4th Generation wRfx: NONREACTIVE

## 2019-12-27 LAB — BASIC METABOLIC PANEL
Anion gap: 12 (ref 5–15)
BUN: 15 mg/dL (ref 6–20)
CO2: 23 mmol/L (ref 22–32)
Calcium: 9 mg/dL (ref 8.9–10.3)
Chloride: 103 mmol/L (ref 98–111)
Creatinine, Ser: 1.01 mg/dL — ABNORMAL HIGH (ref 0.44–1.00)
GFR calc Af Amer: >60 mL/min (ref >60)
GFR calc non Af Amer: >60 mL/min (ref >60)
Glucose, Bld: 92 mg/dL (ref 70–99)
Potassium: 4.1 mmol/L (ref 3.5–5.1)
Sodium: 138 mmol/L (ref 135–145)

## 2019-12-27 NOTE — Evaluation (Addendum)
Speech Language Pathology Evaluation Patient Details Name: JABREA KALLSTROM MRN: [id number] DOB: 1976/08/16 Today's Date: 12/27/2019 Time: 4098-1191 SLP Time Calculation (min) (ACUTE ONLY): 23 min  Problem List:  Patient Active Problem List   Diagnosis Date Noted  . Atrial flutter (HCC) 12/27/2019  . CVA (cerebral vascular accident) (HCC) 12/26/2019  . Smoker 12/18/2011  . Menorrhagia 09/20/2011   Past Medical History:  Past Medical History:  Diagnosis Date  . Chronic systolic (congestive) heart failure (HCC)   . Obesity   . Persistent atrial fibrillation (HCC)   . PID (acute pelvic inflammatory disease) 2009   Past Surgical History: No past surgical history on file. HPI:   44 year old female with a recent diagnosis of HFrEF (EF 15-20%) and atrial flutter s/p failed cardioversionx2 presenting with chest pain and palpitations on 12/26/19. She notes that she "felt weird" yesterday for which she called EMS. She notes feeling her heart racing and also had tingling sensation in fingers and lower extremities. She notes that this was intermittent in nature. 12/26/19 MRI brain indicated Question 2 punctate foci of restricted diffusion at both frontoparietal vertex regions. These are tiny and, being at the periphery of the brain, could potentially be artifactual or represent micro embolic infarctions.  Assessment / Plan / Recommendation Clinical Impression  Pt was administerd the [facility] Cognitive Assessment) with a score of 29/30 obtained which is within the normal range (26/30 considered normal); initial symptoms have resolved since admission; speech 100% intelligible within conversation, pt oriented x4; No ST indicated at this time; thank you for this consult.    SLP Assessment  SLP Recommendation/Assessment: Patient does not need any further Speech Language Pathology Services SLP Visit Diagnosis: Other (comment)    Follow Up Recommendations  None    Frequency and Duration   Evaluation  only        SLP Evaluation Cognition  Overall Cognitive Status: Within Functional Limits for tasks assessed Arousal/Alertness: Awake/alert Orientation Level: Oriented X4 Memory: Appears intact Awareness: Appears intact Problem Solving: Appears intact Safety/Judgment: Appears intact       Comprehension  Auditory Comprehension Overall Auditory Comprehension: Appears within functional limits for tasks assessed Yes/No Questions: Within Functional Limits Commands: Within Functional Limits Conversation: Complex Visual Recognition/Discrimination Discrimination: Within Function Limits Reading Comprehension Reading Status: Within funtional limits    Expression Expression Primary Mode of Expression: Verbal Verbal Expression Overall Verbal Expression: Appears within functional limits for tasks assessed Initiation: No impairment Level of Generative/Spontaneous Verbalization: Conversation Repetition: No impairment Naming: No impairment Pragmatics: No impairment Non-Verbal Means of Communication: Not applicable Written Expression Dominant Hand: Right Written Expression: Within Functional Limits   Oral / Motor  Oral Motor/Sensory Function Overall Oral Motor/Sensory Function: Within functional limits Motor Speech Overall Motor Speech: Appears within functional limits for tasks assessed Respiration: Within functional limits Phonation: Normal Resonance: Within functional limits Articulation: Within functional limitis Intelligibility: Intelligible Motor Planning: Witnin functional limits Motor Speech Errors: Not applicable                       Tressie Stalker, M.S., CCC-SLP 12/27/2019, 5:22 PM

## 2019-12-27 NOTE — Progress Notes (Signed)
STROKE TEAM PROGRESS NOTE   HISTORY OF PRESENT ILLNESS (per record) CADEN FATICA is an 44 y.o. female that presented with chest pain and mild left side numbness. She was recently treated in [place] for rapid atrial flutter and CHF. She was started on several medications at that time, including Amiodarone and Eliquis. She has been staying since that time with her mother in [place] and focusing on a low sodium diet. Upon arrival in the ED she was in atrial flutter complaining of chest pain and numbness of her left arm and leg. The ED provider was concerned the numbness could be a stroke given her recent diagnosis of atrial flutter.   Brain MRI Impression: Question 2 punctate foci of restricted diffusion at both frontoparietal vertex regions. These are tiny and, being at the periphery of the brain, could potentially be artifactual. However, my suspicion is that these could represent tiny micro embolic Infarctions.  Cardiology was consulted for treatment of her chest pain and atrial flutter and neurology consulted for potential stroke.   In discussion with the patient she reports intermittent numbness in her left arm and leg, most noticeable in her upper arm and upper thigh started yesterday. She denied any numbness in her face. She denied any associated weakness. She initial stated the numbness had completely resolved, however upon examination with light touch she thought the sensation in both her left arm and left leg was very slightly less when compared to the right.   Date last known well: 12/25/19  tPA Given: No. Outside tPA window.  Modified Rankin: 0   INTERVAL HISTORY Patient states left-sided numbness appears to be improving but is still not completely resolved.  She denies prior history of strokes and TIAs.  She admits to snoring as well as feeling tired despite sleeping.  She has not been evaluated for sleep apnea.  I personally reviewed history of presenting illness in detail  with the patient and electronic medical records and imaging films in PACS.    OBJECTIVE Vitals:   12/27/19 0359 12/27/19 0500 12/27/19 0805 12/27/19 1152  BP: 96/68 95/61 95/63  94/73  Pulse: 66 64 67 64  Resp: 17  17 18   Temp: 97.8 F (36.6 C) 98.7 F (37.1 C) 98 F (36.7 C) 98.2 F (36.8 C)  TempSrc: Oral Oral Oral Oral  SpO2:  98% 98% 98%    CBC:  Recent Labs  Lab 12/26/19 0555 12/27/19 0652  WBC 6.5 6.7  HGB 17.6* 16.9*  HCT 54.6* 52.9*  MCV 91.8 92.6  PLT 368 353    Basic Metabolic Panel:  Recent Labs  Lab 12/26/19 0555 12/27/19 0652  NA 141 138  K 4.4 4.1  CL 105 103  CO2 23 23  GLUCOSE 116* 92  BUN 18 15  CREATININE 1.12* 1.01*  CALCIUM 9.5 9.0    Lipid Panel:     Component Value Date/Time   CHOL 167 12/27/2019 0537   TRIG 118 12/27/2019 0537   HDL 41 12/27/2019 0537   CHOLHDL 4.1 12/27/2019 0537   VLDL 24 12/27/2019 0537   LDLCALC 102 (H) 12/27/2019 0537   HgbA1c: No results found for: HGBA1C Urine Drug Screen: No results found for: LABOPIA, COCAINSCRNUR, LABBENZ, AMPHETMU, THCU, LABBARB  Alcohol Level No results found for: [facility]  IMAGING  DG Chest 2 View 12/26/2019 IMPRESSION:  1. Borderline cardiomegaly without failure.  2. Scattered opacities at the lung bases likely reflect atelectasis.   MR BRAIN WO CONTRAST 12/26/2019 IMPRESSION:  Question 2 punctate  foci of restricted diffusion at both frontoparietal vertex regions. These are tiny and, being at the periphery of the brain, could potentially be artifactual. However, my suspicion is that these could represent tiny micro embolic infarctions.   ECHOCARDIOGRAM COMPLETE 12/26/2019 IMPRESSIONS   1. Left ventricular thrombus is not seen. Left ventricular ejection fraction, by estimation, is 20 to 25%. The left ventricle has severely decreased function. The left ventricle demonstrates global hypokinesis. The left ventricular internal cavity size was mildly dilated. Left ventricular diastolic  function could not be evaluated.   2. Right ventricular systolic function is mildly reduced. The right ventricular size is normal. There is normal pulmonary artery systolic pressure. The estimated right ventricular systolic pressure is 20.5 mmHg.   3. The mitral valve is normal in structure. No evidence of mitral valve regurgitation.   4. The aortic valve is normal in structure. Aortic valve regurgitation is not visualized.   5. The inferior vena cava is normal in size with greater than 50% respiratory variability, suggesting right atrial pressure of 3 mmHg. Conclusion(s)/Recommendation(s): No clear evidence of left ventricular thrombus, but in the setting of stroke, repeat imaging with microbubble Definity contrast may be considered.  VAS US CAROTID 12/27/2019 Summary:  Right Carotid: Velocities in the right ICA are consistent with a 1-39% stenosis.  Left Carotid: Velocities in the left ICA are consistent with a 1-39% stenosis.  Vertebrals: Bilateral vertebral arteries demonstrate antegrade flow.  Final    ECG - atrial flutter - ventricular response 68 BPM (See cardiology reading for complete details)   PHYSICAL EXAM Blood pressure 94/73, pulse 64, temperature 98.2 F (36.8 C), temperature source Oral, resp. rate 18, last menstrual period 12/09/2011, SpO2 98 %. Pleasant obese middle-aged African-American lady not in distress. . Afebrile. Head is nontraumatic. Neck is supple without bruit.    Cardiac exam no murmur or gallop. Lungs are clear to auscultation. Distal pulses are well felt. Neurological Exam ;  Awake  Alert oriented x 3. Normal speech and language.eye movements full without nystagmus.fundi were not visualized. Vision acuity and fields appear normal. Hearing is normal. Palatal movements are normal. Face symmetric. Tongue midline. Normal strength, tone, reflexes and coordination. Normal sensation. Gait deferred.  NIHSS 0 MRS baseline 0    ASSESSMENT/PLAN [name] is  a 44 y.o. female with history of tobacco use and atrial fib / flutter (Eliquis and amiodarone) presenting with CHF, chest pain and left sided numbness. Cardiology also consulted. She did not receive IV t-PA due to late presentation (>4.5 hours from time of onset).  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 These are tiny and, being at the periphery of the brain, could potentially be artifactual. However, my suspicion is that these could represent tiny micro embolic infarctions.  MRA head - not ordered  CTA H&N - not ordered  CT Perfusion - not ordered  Carotid Doppler - unremarkable  2D Echo - EF 20 - 25%. No cardiac source of emboli identified.   Sars Corona Virus 2 - negative  LDL - 102  HgbA1c - pending  UDS - not ordered  VTE prophylaxis - Eliquis Diet  Diet Order            Diet Heart Room service appropriate? Yes;  Fluid consistency: Thin  Diet effective now              Eliquis (apixaban) daily prior to admission, now on Eliquis (apixaban) daily  Patient counseled to be compliant with her antithrombotic medications  Ongoing aggressive stroke risk factor management  Therapy recommendations:  pending  Disposition:  Pending  Hypertension / CHF  Home BP meds: metoprolol ; Entresto ; Aldactone  Current BP meds: metoprolol ; Entresto ; Aldactone ; Lasix  Systolic blood pressure somewhat low  . Permissive hypertension (OK if < 220/120) but gradually normalize in 5-7 days  . Long-term BP goal normotensive  Hyperlipidemia  Home Lipid lowering medication: none   LDL 102, goal < 70  Current lipid lowering medication: none - Add lipitor 40 mg daily  Continue statin at discharge  Other Stroke Risk  Factors  Cigarette smoker - advised to stop smoking  ETOH use, advised to drink no more than 1 alcoholic beverage per day.  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  I discussed alternatives to Eliquis as well as lack of definitive data suggesting switching to Pradaxa or SAVR size any better.  Patient wants to continue on Eliquis.  Continue ongoing stroke work-up and aggressive risk factor modification.  Patient may consider possible participation in sleep smart stroke prevention trial for sleep apnea if interested and was given written information to review and decide.  Continue Eliquis and add Lipitor.  Greater than 50% time during this 35-minute visit was spent on counseling and coordination of care about her embolic stroke and answering questions. Antony Contras, MD To contact Stroke Continuity provider, please refer to [email or web address] After hours, contact General Neurology

## 2019-12-27 NOTE — Evaluation (Signed)
Physical Therapy Evaluation Patient Details Name: Lisa Schaefer MRN: [id number] DOB: 18-Apr-1976 Today's Date: 12/27/2019   History of Present Illness  Patient is a 44 year old female with acute onset of  right UE tingling and weakness. it has since resolved. she was recently hospitalized with A-flutter. PMH: obesity, CHF  Clinical Impression  Patient appears to be at baseline function. Her R UE symptoms have resolved. She has equal strength and good coordination in her right hand. She transferred and ambulated without syncope. She has no need for skilled PT at this time.     Follow Up Recommendations No PT follow up    Equipment Recommendations  None recommended by PT    Recommendations for Other Services       Precautions / Restrictions Precautions Precautions: None Restrictions Weight Bearing Restrictions: No      Mobility  Bed Mobility Overal bed mobility: Independent             General bed mobility comments: Patient found edge of the bed. She has been getting in and out on her own.   Transfers Overall transfer level: Independent               General transfer comment: no lob   Ambulation/Gait Ambulation/Gait assistance: Independent Gait Distance (Feet): 200 Feet Assistive device: Standard walker Gait Pattern/deviations: WFL(Within Functional Limits) Gait velocity: decreased  Gait velocity interpretation: <1.31 ft/sec, indicative of household ambulator General Gait Details: no syncope or LOB   Stairs            Wheelchair Mobility    Modified Rankin (Stroke Patients Only) Modified Rankin (Stroke Patients Only) Pre-Morbid Rankin Score: No symptoms Modified Rankin: No significant disability     Balance Overall balance assessment: Independent                                           Pertinent Vitals/Pain Pain Assessment: No/denies pain    Home Living Family/patient expects to be discharged to:: Private  residence Living Arrangements: Parent Available Help at Discharge: Family Type of Home: House Home Access: Level entry         Additional Comments: lives with mother since her recent hospitilzation     Prior Function Level of Independence: Independent               Hand Dominance   Dominant Hand: Right    Extremity/Trunk Assessment   Upper Extremity Assessment Upper Extremity Assessment: Overall WFL for tasks assessed RUE Deficits / Details: therapy assessed right UE. She had full coodrination and strength in that hand     Lower Extremity Assessment Lower Extremity Assessment: Overall WFL for tasks assessed    Cervical / Trunk Assessment Cervical / Trunk Assessment: Normal  Communication   Communication: No difficulties  Cognition Arousal/Alertness: Awake/alert Behavior During Therapy: WFL for tasks assessed/performed Overall Cognitive Status: Within Functional Limits for tasks assessed                                        General Comments General comments (skin integrity, edema, etc.): tandem astance and narrow base both independent     Exercises     Assessment/Plan    PT Assessment Patent does not need any further PT services  PT Problem List  PT Treatment Interventions      PT Goals (Current goals can be found in the Care Plan section)  Acute Rehab PT Goals Patient Stated Goal: to go home  PT Goal Formulation: With patient Time For Goal Achievement: 01/03/20 Potential to Achieve Goals: Good    Frequency     Barriers to discharge        Co-evaluation               AM-PAC PT "6 Clicks" Mobility  Outcome Measure Help needed turning from your back to your side while in a flat bed without using bedrails?: None Help needed moving from lying on your back to sitting on the side of a flat bed without using bedrails?: None Help needed moving to and from a bed to a chair (including a wheelchair)?: None Help needed  standing up from a chair using your arms (e.g., wheelchair or bedside chair)?: None Help needed to walk in hospital room?: None Help needed climbing 3-5 steps with a railing? : None 6 Click Score: 24    End of Session Equipment Utilized During Treatment: Gait belt Activity Tolerance: Patient tolerated treatment well Patient left: in bed;with call bell/phone within reach Nurse Communication: Mobility status PT Visit Diagnosis: Other abnormalities of gait and mobility (R26.89)    Time: 1130-1146 PT Time Calculation (min) (ACUTE ONLY): 16 min   Charges:   PT Evaluation $PT Eval Low Complexity: 1 Low           Dessie Coma PT DPT  12/27/2019, 2:18 PM

## 2019-12-27 NOTE — Discharge Instructions (Signed)

## 2019-12-27 NOTE — Progress Notes (Signed)
OT Cancellation Note  Patient Details Name: Lisa Schaefer MRN: [id number] DOB: 11-12-1975   Cancelled Treatment:    Reason Eval/Treat Not Completed: OT screened, no needs identified, will sign off(Pt's symptoms have resolved. Pt reporting no deficits.)  No parasthesias present.  Flora Lipps, OTR/L Acute Rehabilitation Services Pager: [phone] Office: [phone]   Flora Lipps 12/27/2019, 12:38 PM

## 2019-12-27 NOTE — Progress Notes (Signed)
Subjective: HD: 1 No acute overnight events.  Patient evaluated on rounds this morning. She is resting comfortably in bed and does not appear to be in any acute distress.  She has not had any further chest discomfort or any further numbness or tingling in her extremities.   Objective:  Vital signs in last 24 hours: Vitals:   12/26/19 2048 12/26/19 2100 12/26/19 2347 12/27/19 0359  BP: 120/72  94/81 96/68  Pulse: (!) 45  81 66  Resp: 18  16 17   Temp: 97.7 F (36.5 C)  98.1 F (36.7 C) 97.8 F (36.6 C)  TempSrc: Oral  Oral Oral  SpO2: 95% 96% 99%    CBC Latest Ref Rng & Units 12/26/2019 09/20/2011 02/09/2008  WBC 4.0 - 10.5 K/uL 6.5 7.6 8.6  Hemoglobin 12.0 - 15.0 g/dL 17.6(H) 15.0 12.3  Hematocrit 36.0 - 46.0 % 54.6(H) 43.7 36.0  Platelets 150 - 400 K/uL 368 246 353   BMP Latest Ref Rng & Units 12/26/2019 02/08/2008  Glucose 70 - 99 mg/dL 02/10/2008) 676(H)  BUN 6 - 20 mg/dL 18 6  Creatinine 209(O - 1.00 mg/dL 7.09) 6.28(Z  Sodium 6.62 - 145 mmol/L 141 136  Potassium 3.5 - 5.1 mmol/L 4.4 3.3(L)  Chloride 98 - 111 mmol/L 105 102  CO2 22 - 32 mmol/L 23 25  Calcium 8.9 - 10.3 mg/dL 9.5 8.9    Physical Exam  Constitutional: She is oriented to person, place, and time and well-developed, well-nourished, and in no distress.  Eyes: Pupils are equal, round, and reactive to light. EOM are normal.  Cardiovascular: Normal rate, normal heart sounds and intact distal pulses. An irregularly irregular rhythm present.  Pulmonary/Chest: Effort normal and breath sounds normal. No respiratory distress. She has no wheezes. She has no rales.  Abdominal: Soft. Bowel sounds are normal. She exhibits no distension. There is no abdominal tenderness. There is no rebound.  Musculoskeletal:        General: No tenderness or edema. Normal range of motion.  Neurological: She is alert and oriented to person, place, and time. No cranial nerve deficit. Coordination normal.  Skin: Skin is dry.     Assessment/Plan: Lisa Schaefer is a 44 year old female with a past medical history of recently diagnosed atrial flutter and HFrEF (EF 15-20%) who presented with palpitations and tingling in left arm that has since resolved and is admitted for further work up of CVA.   CVA: likely TIA Patient patient presented with transient numbness and tingling in left upper and lower extremities.  This has resolved since admission.  MRI head with questionable punctate limb foci of restricted diffusion at frontoparietal vertex regions.  Her CVA was initially thought to be secondary to cardioembolic stroke however, there is no thrombus seen in left ventricle.  LVEF 20 to 25% with global hypokinesis.  Carotids without any significant stenosis noted on vascular ultrasound.  No ongoing deficits from her stroke. -Neurology consulted, appreciate the recommendations - A1c pending -Lipitor 40 mg daily -Modify risk factors -Patient staying overnight for OSA trial for stroke team recommendations  Persistent Atrial flutter: HFrEF:  Patient with history of recently diagnosed atrial flutter status post 2 failed attempts at cardioversion.  She has ongoing atrial flutter on cardiac telemetry.  Audiology consulted and recommended for outpatient work-up with EP for possible ablation therapy. -Cardiology consulted, appreciate recommendations -Eliquis 5 mg twice daily -Continue cardiac monitoring  AKI:  Patient had mild AKI on admission.  Seems to have improved this morning.  -  BMP daily  Diet: HH Electrolytes: Monitor and replete prn Fluids: None   DVT Prophylaxis: Eliquis Code: FULL   Prior to Admission Living Arrangement: Home Anticipated Discharge Location: Home Barriers to Discharge: Continued medical management  Dispo: Anticipated discharge in approximately 1-2 day(s).   Harvie Heck, MD  Internal Medicine, PGY-1 12/27/2019, 6:18 AM Pager: [phone]

## 2019-12-28 LAB — CBC
HCT: 51.4 % — ABNORMAL HIGH (ref 36.0–46.0)
Hemoglobin: 16.7 g/dL — ABNORMAL HIGH (ref 12.0–15.0)
MCH: 29.8 pg (ref 26.0–34.0)
MCHC: 32.5 g/dL (ref 30.0–36.0)
MCV: 91.8 fL (ref 80.0–100.0)
Platelets: 297 10*3/uL (ref 150–400)
RBC: 5.6 MIL/uL — ABNORMAL HIGH (ref 3.87–5.11)
RDW: 13.4 % (ref 11.5–15.5)
WBC: 6.1 10*3/uL (ref 4.0–10.5)
nRBC: 0 % (ref 0.0–0.2)

## 2019-12-28 LAB — BASIC METABOLIC PANEL
Anion gap: 9 (ref 5–15)
BUN: 18 mg/dL (ref 6–20)
CO2: 23 mmol/L (ref 22–32)
Calcium: 8.8 mg/dL — ABNORMAL LOW (ref 8.9–10.3)
Chloride: 106 mmol/L (ref 98–111)
Creatinine, Ser: 0.94 mg/dL (ref 0.44–1.00)
GFR calc Af Amer: >60 mL/min (ref >60)
GFR calc non Af Amer: >60 mL/min (ref >60)
Glucose, Bld: 93 mg/dL (ref 70–99)
Potassium: 4.1 mmol/L (ref 3.5–5.1)
Sodium: 138 mmol/L (ref 135–145)

## 2019-12-28 LAB — TROPONIN I (HIGH SENSITIVITY)
Troponin I (High Sensitivity): 4 ng/L (ref <18)
Troponin I (High Sensitivity): 4 ng/L (ref <18)

## 2019-12-28 MED ORDER — ATORVASTATIN CALCIUM 40 MG PO TABS: 40.0000 mg | ORAL_TABLET | Freq: Every day | ORAL | Status: DC

## 2019-12-28 MED ORDER — ATORVASTATIN CALCIUM 40 MG PO TABS: 40.0000 mg | ORAL_TABLET | Freq: Every day | ORAL | 0 refills | Status: DC

## 2019-12-28 NOTE — Progress Notes (Signed)
Pt alert & oriented x4. Discharge instructions reviewed with pt. IV and telemetry discontinued. Pt to pick up ordered prescription medications from preferred pharmacy on file.  Pt discharged to home via private vehicle.

## 2019-12-28 NOTE — Progress Notes (Signed)
STROKE TEAM PROGRESS NOTE    INTERVAL HISTORY Patient states l she had a transient episode of left-sided chest pain but that she describes this as muscle pulling sensation and tightness which lasted about half an hour at that time she had some transient increase in her left-sided numbness but both seem to have improved.  Numbness appears to fluctuate.  She did sign consent for the sleep smart study and had overnight [place] 3 monitor but did not qualify as she got does not have significant sleep apnea.  OBJECTIVE Vitals:   12/27/19 1152 12/27/19 1559 12/27/19 2042 12/27/19 2200  BP: 94/73 (!) 105/92 96/61 119/82  Pulse: 64 64 66 65  Resp: 18 18 17    Temp: 98.2 F (36.8 C) 98 F (36.7 C) 97.7 F (36.5 C) 98.2 F (36.8 C)  TempSrc: Oral Oral Oral Oral  SpO2: 98% 100% 98% 99%    CBC:  Recent Labs  Lab 12/27/19 0652 12/28/19 0404  WBC 6.7 6.1  HGB 16.9* 16.7*  HCT 52.9* 51.4*  MCV 92.6 91.8  PLT 353 160    Basic Metabolic Panel:  Recent Labs  Lab 12/27/19 0652 12/28/19 0404  NA 138 138  K 4.1 4.1  CL 103 106  CO2 23 23  GLUCOSE 92 93  BUN 15 18  CREATININE 1.01* 0.94  CALCIUM 9.0 8.8*    Lipid Panel:     Component Value Date/Time   CHOL 167 12/27/2019 0537   TRIG 118 12/27/2019 0537   HDL 41 12/27/2019 0537   CHOLHDL 4.1 12/27/2019 0537   VLDL 24 12/27/2019 0537   LDLCALC 102 (H) 12/27/2019 0537   HgbA1c: No results found for: HGBA1C Urine Drug Screen: No results found for: LABOPIA, COCAINSCRNUR, LABBENZ, AMPHETMU, THCU, LABBARB  Alcohol Level No results found for: [facility]  IMAGING  DG Chest 2 View 12/26/2019 IMPRESSION:  1. Borderline cardiomegaly without failure.  2. Scattered opacities at the lung bases likely reflect atelectasis.   MR BRAIN WO CONTRAST 12/26/2019 IMPRESSION:  Question 2 punctate foci of restricted diffusion at both frontoparietal vertex regions. These are tiny and, being at the periphery of the brain, could potentially be artifactual.  However, my suspicion is that these could represent tiny micro embolic infarctions.   ECHOCARDIOGRAM COMPLETE 12/26/2019 IMPRESSIONS   1. Left ventricular thrombus is not seen. Left ventricular ejection fraction, by estimation, is 20 to 25%. The left ventricle has severely decreased function. The left ventricle demonstrates global hypokinesis. The left ventricular internal cavity size was mildly dilated. Left ventricular diastolic function could not be evaluated.   2. Right ventricular systolic function is mildly reduced. The right ventricular size is normal. There is normal pulmonary artery systolic pressure. The estimated right ventricular systolic pressure is 73.7 mmHg.   3. The mitral valve is normal in structure. No evidence of mitral valve regurgitation.   4. The aortic valve is normal in structure. Aortic valve regurgitation is not visualized.   5. The inferior vena cava is normal in size with greater than 50% respiratory variability, suggesting right atrial pressure of 3 mmHg. Conclusion(s)/Recommendation(s): No clear evidence of left ventricular thrombus, but in the setting of stroke, repeat imaging with microbubble Definity contrast may be considered.  VAS US CAROTID 12/27/2019 Summary:  Right Carotid: Velocities in the right ICA are consistent with a 1-39% stenosis.  Left Carotid: Velocities in the left ICA are consistent with a 1-39% stenosis.  Vertebrals: Bilateral vertebral arteries demonstrate antegrade flow.  Final    ECG -  atrial flutter - ventricular response 68 BPM (See cardiology reading for complete details)   PHYSICAL EXAM Blood pressure 119/82, pulse 65, temperature 98.2 F (36.8 C), temperature source Oral, resp. rate 17, last menstrual period 12/09/2011, SpO2 99 %. Pleasant obese middle-aged African-American lady not in distress. . Afebrile. Head is nontraumatic. Neck is supple without bruit.    Cardiac exam no murmur or gallop. Lungs are clear to auscultation. Distal  pulses are well felt. Neurological Exam ;  Awake  Alert oriented x 3. Normal speech and language.eye movements full without nystagmus.fundi were not visualized. Vision acuity and fields appear normal. Hearing is normal. Palatal movements are normal. Face symmetric. Tongue midline. Normal strength, tone, reflexes and coordination. Normal sensation. Gait deferred.  NIHSS 0 MRS baseline 0    ASSESSMENT/PLAN [name] is a 44 y.o. female with history of tobacco use and atrial fib / flutter (Eliquis and amiodarone) presenting with CHF, chest pain and left sided numbness. Cardiology also consulted. She did not receive IV t-PA due to late presentation (>4.5 hours from time of onset).  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 These are tiny and, being at the periphery of the brain, could potentially be artifactual. However, my suspicion is that these could represent tiny micro embolic infarctions.  MRA head - not ordered  CTA H&N - not ordered  CT Perfusion - not ordered  Carotid Doppler - unremarkable  2D Echo - EF 20 - 25%. No cardiac source of emboli identified.   [place] Corona Virus 2 - negative  LDL - 102  HgbA1c - pending  UDS - not ordered  VTE prophylaxis - Eliquis Diet  Diet Order            Diet Heart Room service appropriate? Yes; Fluid consistency: Thin  Diet effective now              Eliquis (apixaban) daily prior to admission, now on Eliquis (apixaban) daily  Patient counseled to be compliant with her antithrombotic medications  Ongoing aggressive stroke risk factor management  Therapy recommendations:  pending  Disposition:  Pending  Hypertension / CHF  Home BP meds: metoprolol ; Entresto ;  Aldactone  Current BP meds: metoprolol ; Entresto ; Aldactone ; Lasix  Systolic blood pressure somewhat low  . Permissive hypertension (OK if < 220/120) but gradually normalize in 5-7 days  . Long-term BP goal normotensive  Hyperlipidemia  Home Lipid lowering medication: none   LDL 102, goal < 70  Current lipid lowering medication: none - Add lipitor 40 mg daily  Continue statin at discharge  Other Stroke Risk Factors  Cigarette smoker - advised to stop smoking  ETOH use, advised to drink no more than 1 alcoholic beverage per day.  Obesity, recommend weight loss, diet and exercise as appropriate   Family hx stroke   Congestive Heart Failure  Atrial fibrillation  EF 20 - 25%  Other Active Problems  Code status - Full code  AKI - Creatinine - 1.12->1.01  Polycythemia - Hb - 17.6->16.9 ; Hct 54.6->52.9   Hospital day # 1 She continues to have intermittent numbness and expect this to gradually improve over the next few weeks.  The numbness is not severe enough to justify  medications at the present time.  Patient did screen for the sleep smart study but unfortunately did not qualify.    Continue Eliquis and add Lipitor. D/w  Laverna Peace, medical student.Greater than 50% time during this 25-minute visit was spent on counseling and coordination of care about her embolic stroke and answering questions.Stroke team will sign off. Delia Heady To contact Stroke Continuity provider, please refer to [email or web address]. After hours, contact General Neurology

## 2019-12-28 NOTE — Progress Notes (Signed)
Patient ID: Lisa Schaefer, female   DOB: [birth year], 44 y.o.   MRN: [id number]   Subjective: HD: 1  Lisa Schaefer was seen on rounds this morning and reports having an episode of left sided chest discomfort accompanied by feelings of her heart racing. The episode initially lasted about 15 minutes, then resolved completely. The pain then returned about 20 minutes later again accompanied by feelings of heart racing. She is otherwise feeling fine with no radiation of the pain, no nausea, nor diaphoresis.   Objective:  Vital signs in last 24 hours: Vitals:   12/27/19 1152 12/27/19 1559 12/27/19 2042 12/27/19 2200  BP: 94/73 (!) 105/92 96/61 119/82  Pulse: 64 64 66 65  Resp: 18 18 17    Temp: 98.2 F (36.8 C) 98 F (36.7 C) 97.7 F (36.5 C) 98.2 F (36.8 C)  TempSrc: Oral Oral Oral Oral  SpO2: 98% 100% 98% 99%   Weight change:   Intake/Output Summary (Last 24 hours) at 12/28/2019 0705 Last data filed at 12/27/2019 1700 Gross per 24 hour  Intake 960 ml  Output -  Net 960 ml   Physical Exam  Cardiovascular: Normal rate and intact distal pulses. An irregularly irregular rhythm present.  Respiratory: Effort normal and breath sounds normal. She exhibits tenderness.  GI: Soft. There is no abdominal tenderness.  Neurological: She is alert. She has normal strength. A sensory deficit is present.  Decreased sensation in L arm.  Skin: Skin is warm and dry.    CBC Latest Ref Rng & Units 12/28/2019 12/27/2019 12/26/2019  WBC 4.0 - 10.5 K/uL 6.1 6.7 6.5  Hemoglobin 12.0 - 15.0 g/dL 16.7(H) 16.9(H) 17.6(H)  Hematocrit 36.0 - 46.0 % 51.4(H) 52.9(H) 54.6(H)  Platelets 150 - 400 K/uL 297 353 368   BMP Latest Ref Rng & Units 12/28/2019 12/27/2019 12/26/2019  Glucose 70 - 99 mg/dL 93 92 116(H)  BUN 6 - 20 mg/dL 18 15 18   Creatinine 0.44 - 1.00 mg/dL 0.94 1.01(H) 1.12(H)  Sodium 135 - 145 mmol/L 138 138 141  Potassium 3.5 - 5.1 mmol/L 4.1 4.1 4.4  Chloride 98 - 111 mmol/L 106 103 105  CO2 22 - 32  mmol/L 23 23 23   Calcium 8.9 - 10.3 mg/dL 8.8(L) 9.0 9.5    Assessment/Plan:  Active Problems:   CVA (cerebral vascular accident) ([place])   Atrial flutter ([place])  Summary:  Lisa Schaefer is a 44 year old female with a history of recently diagnosed HFrEF (15-20%) and atrial flutter who presented with feelings of palpitations and L arm tingling that has since resolved and was admitted for CVA work up on HD 2.  CVA, likely TIA:  The patient presented with transient tingling in her left arm and both feet. MRI showed possible bilateral punctate foci of restricted diffusion in frontoparietal vertex regions. This was thought initially to be due to emboli secondary to sustained atrial flutter. However, echo revealed no thrombus. Carotid duplex was also unremarkable. On exam today she had decreased sensation in her L arm. This is the same feelings she had on presentation. Spoke with neurology, and since it is the same as previous symtoms it is not concerning. Decreased sensation likely associated with symptomatic atrial flutter which had onset at the same time. Neurology following; we greatly appreciate their recommendations.  - on lipitor 40 mg  - HgbA1c pending - sleep smart stroke prevention trial for OSA; if positive will stay one more night for CPAP trial  - continue eliqius   Atrial  flutter, HFrEF (15-20%):  Patient was recently diagnosed with HFrEF and sustained atrial flutter s/p failed cardioversion 2x. Patient endorses compliance with medication regimen and fluid restriction. Patient reported another episode of chest pain this morning accompanied by palpitations. Telemetry was reviewed and showed atrial flutter with HR about 110. On exam her chest was tender to palpation and chest pain is felt to be musculoskeletal in nature and/or related to symptomatic atrial flutter. Cardiology consulted to optimize treatment plan; we appreciate their recommendations.  - eliquis 5 mg BID - continue home HF  meds - consider increasing amiodarone dosage - telemetry monitoring   AKI: Resolved. Creatinine now 0.94, down from 1.01.    OT/PT: no follow up needed; no recommendations  Diet: HH Fluids: None  DVT Prophylaxis: Eliquis Code: FULL   Prior to Admission Living Arrangement: Home Anticipated Discharge Location: Home Barriers to Discharge: Continued medical management  Dispo: Anticipated discharge in approximately 1-2 day(s). Pending OSA trial results.    LOS: 1 day   Laverna Peace, Medical Student 12/28/2019, 7:05 AM

## 2019-12-28 NOTE — Plan of Care (Signed)
Patient stable, discussed POC with patient, agreeable with plan, denies question/concerns at this time.  

## 2019-12-28 NOTE — Discharge Summary (Addendum)
Name: Lisa Schaefer MRN: [id number] DOB: 07/24/1976 44 y.o. PCP: Patient, No Pcp Per  Date of Admission: 12/26/2019  5:30 AM Date of Discharge: 12/28/2019 Attending Physician: Dr. Reymundo Poll Discharge Diagnosis: 1. TIA 2. Symptomatic atrial flutter 3. HFrEF  Discharge Medications: Allergies as of 12/28/2019   No Known Allergies      Medication List     TAKE these medications    amiodarone 200 MG tablet Commonly known as: PACERONE Take 200 mg by mouth 2 (two) times daily.   apixaban 5 MG Tabs tablet Commonly known as: ELIQUIS Take 5 mg by mouth 2 (two) times daily.   atorvastatin 40 MG tablet Commonly known as: LIPITOR Take 1 tablet (40 mg total) by mouth daily at 6 PM.   Entresto 24-26 MG Generic drug: sacubitril-valsartan Take 1 tablet by mouth 2 (two) times daily.   FLUoxetine 20 MG capsule Commonly known as: PROZAC Take 20 mg by mouth daily.   furosemide 40 MG tablet Commonly known as: LASIX Take 40 mg by mouth daily.   metoprolol succinate 50 MG 24 hr tablet Commonly known as: TOPROL-XL Take 50 mg by mouth 2 (two) times daily.   spironolactone 25 MG tablet Commonly known as: ALDACTONE Take 25 mg by mouth daily.        Disposition and follow-up:   Lisa Schaefer was discharged from [facility] in stable condition.  At the hospital follow up visit please address:  1.  Any persistent symptoms, amiodarone dosage  2.  Labs / imaging needed at time of follow-up: None  3.  Pending labs/ test needing follow-up: HgbA1c  Follow-up Appointments:  Cardiologist appointment       04/13 Internal medicine acute care clinic follow up     04/19 2:15 PM   Hospital Course by problem list: 1. TIA Lisa Schaefer is a 44 year old female with a history of recently diagnosed atrial flutter and HFrEF of 15-20% who presented sue to feelings of chest discomfort, palpitations, and L arm and bilateral foot numbness. She was admitted for CVA work  up. Symptoms resolved by time she was seen in the ED. MRI showed questionable 2 punctate foci of restricted diffusion at both frontoparietal vertex regions, which were thought to be either artifactual or micro embolic infarctions. Echo was done which did not reveal any thrombus, but did show reduced EF of 20-25%, global hypokinesis, and mildly dilated LV. Carotid vascular ultrasound was unremarkable. 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 Goals of care were directed toward risk factor modification and lipitor was added her medication regimen. She underwent sleep smart prevention trial for OSA which was negative and neurology cleared patient for discharge on 04/11.  2. Symptomatic atrial flutter Lisa Schaefer was recently diagnosed with sustained atrial flutter status post failed cardioversion x2. During her hospitalization she experienced intermittent feelings of chest discomfort accompanied by palpitations. Cardiology was consulted for management and optimization of her medications.   3. HFrEF Lisa Schaefer was recently diagnosed with HFrEF. Echo during this admission showed reduced EF of 20-25%, global hypokinesis, and mildly dilated LV. Cardiology was consulted  for management and optimization of her medications.   Discharge Vitals:   BP 114/81 (BP Location: Right Arm)   Pulse 63   Temp (!) 97.3 F (36.3 C) (Oral)   Resp 17   LMP 12/09/2011   SpO2 98%   Pertinent  Labs, Studies, and Procedures:  CBC Latest Ref Rng & Units 12/28/2019 12/27/2019 12/26/2019  WBC 4.0 - 10.5 K/uL 6.1 6.7 6.5  Hemoglobin 12.0 - 15.0 g/dL 16.7(H) 16.9(H) 17.6(H)  Hematocrit 36.0 - 46.0 % 51.4(H) 52.9(H) 54.6(H)  Platelets 150 - 400 K/uL 297 353 368   BMP Latest Ref Rng & Units 12/28/2019 12/27/2019 12/26/2019  Glucose 70 - 99 mg/dL 93 92 116(H)  BUN 6 - 20 mg/dL 18 15 18   Creatinine 0.44 - 1.00 mg/dL 0.94 1.01(H)  1.12(H)  Sodium 135 - 145 mmol/L 138 138 141  Potassium 3.5 - 5.1 mmol/L 4.1 4.1 4.4  Chloride 98 - 111 mmol/L 106 103 105  CO2 22 - 32 mmol/L 23 23 23   Calcium 8.9 - 10.3 mg/dL 8.8(L) 9.0 9.5   Lipid Panel     Component Value Date/Time   CHOL 167 12/27/2019 0537   TRIG 118 12/27/2019 0537   HDL 41 12/27/2019 0537   CHOLHDL 4.1 12/27/2019 0537   VLDL 24 12/27/2019 0537   LDLCALC 102 (H) 12/27/2019 0537    Brain MRI without contrast:  Question 2 punctate foci of restricted diffusion at both frontoparietal vertex regions. These are tiny and, being at the periphery of the brain, could potentially be artifactual. However, my suspicion is that these could represent tiny micro embolic Infarctions.  Echo: 1. Left ventricular thrombus is not seen. Left ventricular ejection  fraction, by estimation, is 20 to 25%. The left ventricle has severely  decreased function. The left ventricle demonstrates global hypokinesis.  The left ventricular internal cavity size  was mildly dilated. Left ventricular diastolic function could not be  evaluated.   2. Right ventricular systolic function is mildly reduced. The right  ventricular size is normal. There is normal pulmonary artery systolic  pressure. The estimated right ventricular systolic pressure is 67.3 mmHg.   3. The mitral valve is normal in structure. No evidence of mitral valve  regurgitation.   4. The aortic valve is normal in structure. Aortic valve regurgitation is  not visualized.   5. The inferior vena cava is normal in size with greater than 50%  respiratory variability, suggesting right atrial pressure of 3 mmHg.  Conclusion(s)/Recommendation(s): No clear evidence of left ventricular  thrombus, but in the setting of stroke, repeat imaging with microbubble  Definity contrast may be considered.   Carotid duplex:  Right Carotid: Velocities in the right ICA are consistent with a 1-39%  stenosis.  Left Carotid: Velocities in the  left ICA are consistent with a 1-39%  stenosis.  Vertebrals: Bilateral vertebral arteries demonstrate antegrade flow.   Discharge Instructions: Discharge Instructions     Call MD for:  difficulty breathing, headache or visual disturbances   Complete by: As directed    Call MD for:  persistant dizziness or light-headedness   Complete by: As directed    Diet - low sodium heart healthy   Complete by: As directed    Discharge instructions   Complete by: As directed    Please make sure to follow up with your cardiologist as scheduled on Tuesday afternoon. Take your medications as instructed. Also recommend you to get a primary care. I will ask our clinic for one time post hospital follow up visit. They will call you for that.   Increase activity slowly   Complete by: As directed        Signed: Mikael Spray, Medical Student 12/31/2019, 8:17 AM   Pager: @[phone] @  Attestation for Student Documentation:  I  personally was present and performed or re-performed the history, physical exam and medical decision-making activities of this service and have verified that the service and findings are accurately documented in the student's note.  Eliezer Bottom, MD Internal Medicine, PGY-1 12/31/2019, 11:43 AM Pager: [phone]

## 2019-12-28 NOTE — Progress Notes (Signed)
Patient had episodes of heart racing and tenderness on chest. Also numbness and tingling at left arm before DC. We monitored her on Tele and checked Trop.>Negative Talked to Dr. Pearlean Brownie. He does not think it is concerning for acute CVA. I evaluated Lisa Schaefer twice after the episode.  HR initialy up at 12-s But then remaiend normal at 60-70.  She now feels better and no heart racing. No chest pain. She feels comfrotable to go home tonight. -DC home to follow up with cards on Tuesday

## 2019-12-29 LAB — HEMOGLOBIN A1C
Hgb A1c MFr Bld: 6 % — ABNORMAL HIGH (ref 4.8–5.6)
Mean Plasma Glucose: 126 mg/dL

## 2020-01-05 ENCOUNTER — Ambulatory Visit: Payer: Self-pay | Admitting: Internal Medicine

## 2020-01-05 ENCOUNTER — Encounter: Payer: Self-pay | Admitting: Internal Medicine

## 2020-01-05 VITALS — BP 104/62 | HR 52 | Temp 98.0°F | Ht 70.0 in | Wt 269.7 lb

## 2020-01-05 DIAGNOSIS — Z79899 Other long term (current) drug therapy: Secondary | ICD-10-CM

## 2020-01-05 DIAGNOSIS — Z8673 Personal history of transient ischemic attack (TIA), and cerebral infarction without residual deficits: Secondary | ICD-10-CM

## 2020-01-05 DIAGNOSIS — I420 Dilated cardiomyopathy: Secondary | ICD-10-CM

## 2020-01-05 DIAGNOSIS — I4892 Unspecified atrial flutter: Secondary | ICD-10-CM

## 2020-01-05 DIAGNOSIS — Z7901 Long term (current) use of anticoagulants: Secondary | ICD-10-CM

## 2020-01-05 NOTE — Patient Instructions (Signed)
Lisa Schaefer, It was great meeting you!   I hope the appointment goes well with Dr. Clovis Riley and you are able to get your heart rhythm fixed.   We will not make any changes to your medications today. Bring up with the cardiologist about the issue with atorvastatin and see if they have any recommendations.   If you are interested in continuing to see [place] for primary care, feel free to call at [phone] to schedule another appointment.   Take care! Dr. Chesley Mires

## 2020-01-05 NOTE — Progress Notes (Signed)
New Patient Office Visit  Subjective:  Patient ID: Lisa Schaefer, female    DOB: [birth year]  Age: 44 y.o. MRN: [id number]  CC:  Chief Complaint  Patient presents with  . Follow-up    HOSPITAL FOLLOW UP ? ONE TIME VISIT PATIENT NOT SURE.    HPI Lisa Schaefer presents for hospital follow-up for TIA, aflutter with RVR.   Past Medical History:  Diagnosis Date  . Chronic systolic (congestive) heart failure ([place])   . Obesity   . Persistent atrial fibrillation ([place])   . PID (acute pelvic inflammatory disease) 2009    History reviewed. No pertinent surgical history.  Family History  Problem Relation Age of Onset  . Diabetes Mother   . Hypertension Father   . Cancer Father   . Arthritis Sister     Social History   Socioeconomic History  . Marital status: Single    Spouse name: Not on file  . Number of children: Not on file  . Years of education: Not on file  . Highest education level: Not on file  Occupational History  . Not on file  Tobacco Use  . Smoking status: Former Smoker    Packs/day: 0.50    Years: 18.00    Pack years: 9.00    Types: Cigarettes    Quit date: 10/20/2019    Years since quitting: 0.2  . Smokeless tobacco: Former Systems developer    Quit date: 10/2019  Substance and Sexual Activity  . Alcohol use: Yes    Comment: 2-3 x month  . Drug use: No  . Sexual activity: Yes    Partners: Male    Birth control/protection: Condom  Other Topics Concern  . Not on file  Social History Narrative  . Not on file   Social Determinants of Health   Financial Resource Strain:   . Difficulty of Paying Living Expenses:   Food Insecurity:   . Worried About Charity fundraiser in the Last Year:   . Arboriculturist in the Last Year:   Transportation Needs:   . Film/video editor (Medical):   Marland Kitchen Lack of Transportation (Non-Medical):   Physical Activity:   . Days of Exercise per Week:   . Minutes of Exercise per Session:   Stress:   . Feeling of Stress :   Social  Connections:   . Frequency of Communication with Friends and Family:   . Frequency of Social Gatherings with Friends and Family:   . Attends Religious Services:   . Active Member of Clubs or Organizations:   . Attends Archivist Meetings:   Marland Kitchen Marital Status:   Intimate Partner Violence:   . Fear of Current or Ex-Partner:   . Emotionally Abused:   Marland Kitchen Physically Abused:   . Sexually Abused:     ROS Review of Systems  Constitutional: Negative for activity change, chills and fever.  Respiratory: Negative for cough and shortness of breath.   Cardiovascular: Positive for palpitations. Negative for chest pain and leg swelling.  Gastrointestinal: Negative for nausea.  Neurological: Negative for dizziness, syncope, facial asymmetry, weakness, light-headedness, numbness and headaches.    Objective:   Today's Vitals: BP 104/62 (BP Location: Left Arm, Patient Position: Sitting, Cuff Size: Normal)   Pulse (!) 52 Comment: PATIENT STATES PULSE RUNS LOW / TAKES IT AT HOME  Temp 98 F (36.7 C) (Oral)   Ht 5\' 10"  (1.778 m)   Wt 269 lb 11.2 oz (122.3 kg)  LMP 12/09/2011   SpO2 100%   BMI 38.70 kg/m   Physical Exam Constitutional:      General: She is not in acute distress.    Appearance: Normal appearance.  Eyes:     Conjunctiva/sclera: Conjunctivae normal.  Cardiovascular:     Rate and Rhythm: Regular rhythm. Bradycardia present.     Pulses: Normal pulses.  Pulmonary:     Effort: Pulmonary effort is normal.     Breath sounds: Normal breath sounds.  Abdominal:     General: There is no distension.     Palpations: Abdomen is soft.     Tenderness: There is no abdominal tenderness.  Musculoskeletal:        General: No swelling.     Cervical back: Normal range of motion and neck supple.  Skin:    General: Skin is warm and dry.  Neurological:     General: No focal deficit present.     Mental Status: She is alert and oriented to person, place, and time.  Psychiatric:         Mood and Affect: Mood normal.        Behavior: Behavior normal.     Assessment & Plan:   Problem List Items Addressed This Visit      Cardiovascular and Mediastinum   Atrial flutter (HCC)    Patient was admitted from 4/9-4/11 for stroke-like symptoms that had resolved by the time of admission. She as found to be in aflutter with RVR. TEE directed cardioversion was attempted while on IV amiodarone two different times during admission which were unsuccessful. She as discharged on PO amiodarone. She has already seen cardiologist at [facility] who has referred her to an EP specialist at [facility] for potential ablation.   She has had improvement in her activity tolerance. Denies significant light headedness, syncope or chest pain. Still gets short of breath with exertion. She is on Eliquis for anticoagulation. Tolerating amio and metoprolol without adverse effects.       Dilated cardiomyopathy (HCC) - Primary    During her hospital work-up, echo showed severely dilated cardiomyopathy with EF of 20%. Felt to be tachyarrhythmia induced. Hope is that EF will recover once she is returned to normal sinus.  Started on GDMT with Entresto, Lasix, Spiro and Metoprolol. She denies lower extremity swelling, orthopnea, PND, or chest pain.  Patient will plan to follow-up with a PCP either through [facility] or with [facility] in a few months after cardiac issues are addressed.           Outpatient Encounter Medications as of 01/05/2020  Medication Sig  . amiodarone (PACERONE) 200 MG tablet Take 200 mg by mouth 2 (two) times daily.  Marland Kitchen apixaban (ELIQUIS) 5 MG TABS tablet Take 5 mg by mouth 2 (two) times daily.  Marland Kitchen atorvastatin (LIPITOR) 40 MG tablet Take 1 tablet (40 mg total) by mouth daily at 6 PM.  . ENTRESTO 24-26 MG Take 1 tablet by mouth 2 (two) times daily.  Marland Kitchen FLUoxetine (PROZAC) 20 MG capsule Take 20 mg by mouth daily.  . furosemide (LASIX) 40 MG tablet Take 40 mg by mouth daily.  . metoprolol succinate  (TOPROL-XL) 50 MG 24 hr tablet Take 50 mg by mouth 2 (two) times daily.  Marland Kitchen spironolactone (ALDACTONE) 25 MG tablet Take 25 mg by mouth daily.   No facility-administered encounter medications on file as of 01/05/2020.    Follow-up: Return if symptoms worsen or fail to improve.   Bridget Hartshorn, DO

## 2020-01-07 ENCOUNTER — Encounter: Payer: Self-pay | Admitting: Internal Medicine

## 2020-01-07 DIAGNOSIS — I420 Dilated cardiomyopathy: Secondary | ICD-10-CM | POA: Insufficient documentation

## 2020-01-07 NOTE — Assessment & Plan Note (Addendum)
During her hospital work-up, echo showed severely dilated cardiomyopathy with EF of 20%. Felt to be tachyarrhythmia induced. Hope is that EF will recover once she is returned to normal sinus.  Started on GDMT with Entresto, Lasix, Spiro and Metoprolol. She denies lower extremity swelling, orthopnea, PND, or chest pain.  Patient will plan to follow-up with a PCP either through [facility] or with [facility] in a few months after cardiac issues are addressed.

## 2020-01-07 NOTE — Progress Notes (Signed)
Internal Medicine Clinic Attending  Case discussed with Dr. Bloomfield at the time of the visit.  We reviewed the resident's history and exam and pertinent patient test results.  I agree with the assessment, diagnosis, and plan of care documented in the resident's note.  

## 2020-01-07 NOTE — Assessment & Plan Note (Addendum)
Patient was admitted from 4/9-4/11 for stroke-like symptoms that had resolved by the time of admission. She as found to be in aflutter with RVR. TEE directed cardioversion was attempted while on IV amiodarone two different times during admission which were unsuccessful. She as discharged on PO amiodarone. She has already seen cardiologist at [facility] who has referred her to an EP specialist at [facility] for potential ablation.   She has had improvement in her activity tolerance. Denies significant light headedness, syncope or chest pain. Still gets short of breath with exertion. She is on Eliquis for anticoagulation. Tolerating amio and metoprolol without adverse effects.

## 2020-01-15 ENCOUNTER — Other Ambulatory Visit: Payer: Self-pay

## 2020-01-15 MED ORDER — FLUOXETINE HCL 20 MG PO CAPS: 20.0000 mg | ORAL_CAPSULE | Freq: Every day | ORAL | 0 refills | Status: DC

## 2020-01-15 NOTE — Telephone Encounter (Signed)
FLUoxetine (PROZAC) 20 MG capsule   Refill request @ [facility] in [place].

## 2020-01-24 ENCOUNTER — Other Ambulatory Visit: Payer: Self-pay | Admitting: Internal Medicine

## 2020-02-19 ENCOUNTER — Other Ambulatory Visit: Payer: Self-pay

## 2020-02-19 ENCOUNTER — Ambulatory Visit: Payer: Medicaid Other

## 2020-02-19 ENCOUNTER — Ambulatory Visit: Payer: Self-pay | Admitting: Internal Medicine

## 2020-02-19 VITALS — BP 97/76 | HR 47 | Ht 70.0 in | Wt 270.3 lb

## 2020-02-19 DIAGNOSIS — E669 Obesity, unspecified: Secondary | ICD-10-CM

## 2020-02-19 DIAGNOSIS — F329 Major depressive disorder, single episode, unspecified: Secondary | ICD-10-CM

## 2020-02-19 DIAGNOSIS — I4892 Unspecified atrial flutter: Secondary | ICD-10-CM

## 2020-02-19 DIAGNOSIS — R001 Bradycardia, unspecified: Secondary | ICD-10-CM

## 2020-02-19 DIAGNOSIS — I5020 Unspecified systolic (congestive) heart failure: Secondary | ICD-10-CM

## 2020-02-19 DIAGNOSIS — F32A Depression, unspecified: Secondary | ICD-10-CM

## 2020-02-19 DIAGNOSIS — I4819 Other persistent atrial fibrillation: Secondary | ICD-10-CM

## 2020-02-19 MED ORDER — FLUOXETINE HCL 20 MG PO CAPS: 20.0000 mg | ORAL_CAPSULE | Freq: Every day | ORAL | 3 refills | Status: DC

## 2020-02-19 NOTE — Progress Notes (Signed)
   CC: Depression folllow up   HPI:  [name] is a 44 y.o. with a history as noted below who presents for refill of her Prozac.  Please refer to the problem based charting for details.  On amiodarone 200 mg daily, entresto 24-26 mg daily, lasix, spiro 25 mg dailyand Eliquis    Past Medical History:  Diagnosis Date  . Chronic systolic (congestive) heart failure (HCC)   . Obesity   . Persistent atrial fibrillation (HCC)   . PID (acute pelvic inflammatory disease) 2009   Review of Systems:  Negative unless in the HPI might  Physical Exam:  Vitals:   02/19/20 1516  BP: 97/76  Pulse: (!) 47  SpO2: 100%  Weight: 270 lb 4.8 oz (122.6 kg)  Height: 5\' 10"  (1.778 m)   Physical Exam Vitals reviewed.  Constitutional:      General: She is not in acute distress.    Appearance: Normal appearance. She is obese. She is not ill-appearing, toxic-appearing or diaphoretic.  HENT:     Head: Normocephalic and atraumatic.  Cardiovascular:     Rate and Rhythm: Normal rate and regular rhythm.     Pulses: Normal pulses.     Heart sounds: Normal heart sounds. No murmur. No friction rub. No gallop.   Pulmonary:     Effort: Pulmonary effort is normal. No respiratory distress.     Breath sounds: Normal breath sounds. No wheezing or rales.  Abdominal:     General: Bowel sounds are normal. There is no distension.     Palpations: Abdomen is soft.     Tenderness: There is no abdominal tenderness. There is no guarding.  Musculoskeletal:     Right lower leg: No edema.     Left lower leg: No edema.  Skin:    General: Skin is warm.  Neurological:     General: No focal deficit present.     Mental Status: She is alert and oriented to person, place, and time.     Gait: Gait normal.  Psychiatric:        Mood and Affect: Mood normal.        Behavior: Behavior normal.    Assessment & Plan:   See Encounters Tab for problem based charting.  Patient discussed with Dr. 

## 2020-02-19 NOTE — Patient Instructions (Signed)
Thank you for visiting [place] in clinic today.  Below is a summary of what we discussed:  1.  Slow heart rate -Your heart rate and blood pressure was a little low when you came into our clinic today.  I recommend that you do not take your metoprolol this evening, and follow-up with your cardiologist. -If you feel lightheaded, or feel like you are going to faint, please lay down and put your feet above your head.  2.  Medication refill -We refilled your Prozac.  You can fill this prescription at the pharmacy  3.  Follow-up -Follow-up with your cardiologist tomorrow.  They can review your medication list and make any adjustments necessary.  If you have any questions or concerns, please feel free to reach out to [place].

## 2020-02-20 DIAGNOSIS — R001 Bradycardia, unspecified: Secondary | ICD-10-CM | POA: Insufficient documentation

## 2020-02-20 DIAGNOSIS — F32A Depression, unspecified: Secondary | ICD-10-CM | POA: Insufficient documentation

## 2020-02-20 DIAGNOSIS — I5020 Unspecified systolic (congestive) heart failure: Secondary | ICD-10-CM | POA: Insufficient documentation

## 2020-02-20 NOTE — Assessment & Plan Note (Signed)
Patient has been taking Prozac 20 mg daily for her depression.  She states that most days are good days for her now and she finds great benefit from the medication.  She denies having any SI/HI or depressive thoughts.  she is running low and is asking for refill  Plan: -Refilled Prozac 20 mg daily

## 2020-02-20 NOTE — Assessment & Plan Note (Signed)
Patient was transiently bradycardic on her first set of vital signs, with a blood pressure of 97/76 and a heart rate of 47.  At the time, the patient was asymptomatic and felt like she was her normal self.  She had no dizziness, lightheadedness or headache or vision changes. On subsequent recheck her blood pressure, her systolic pressures increased to the mid 100s and heart rate increased to 70. Patient continues to deny any symptoms of orthostasis.  On exam, the patient had normal S1 and S2 with no murmurs rubs or gallops.  Plan: -Follow-up with cardiology in the morning -Encouraged to hold home metoprolol dose this evening -Educated patient on symptoms of orthostasis

## 2020-02-20 NOTE — Assessment & Plan Note (Signed)
Patient is followed closely by her cardiologist. She is currently taking amiodarone 200 mg daily, Entresto 24-26 mg daily, Lasix 40 mg daily, and spironolactone 25 mg daily as tolerated these medications.  She has a cardiology appointment tomorrow morning  Plan -Follow-up with cardiology tomorrow morning as planned

## 2020-03-12 NOTE — Progress Notes (Signed)
Internal Medicine Clinic Attending  Case discussed with Dr. Alexander at the time of the visit.  We reviewed the resident's history and exam and pertinent patient test results.  I agree with the assessment, diagnosis, and plan of care documented in the resident's note.  

## 2020-05-19 ENCOUNTER — Encounter: Payer: Self-pay | Admitting: Internal Medicine

## 2020-05-19 NOTE — Assessment & Plan Note (Deleted)
Current medications: amidarone 200mg  daily, entresto 24-26mg  daily, lasix 40mg  daily, spirolactone 25mg  daily.

## 2020-05-19 NOTE — Progress Notes (Deleted)
Established Patient Office Visit  Subjective:  Patient ID: Lisa Schaefer, female    DOB: 10-19-1975  Age: 44 y.o. MRN: [id number]  CC: No chief complaint on file.   HPI Lisa Schaefer presents for a 3 month recheck on her chronic medical conditions.    Past Medical History:  Diagnosis Date  . Chronic systolic (congestive) heart failure (HCC)   . Obesity   . Persistent atrial fibrillation (HCC)   . PID (acute pelvic inflammatory disease) 2009     Outpatient Medications Prior to Visit  Medication Sig Dispense Refill  . amiodarone (PACERONE) 200 MG tablet Take 200 mg by mouth 2 (two) times daily.    Marland Kitchen apixaban (ELIQUIS) 5 MG TABS tablet Take 5 mg by mouth 2 (two) times daily.    Marland Kitchen atorvastatin (LIPITOR) 40 MG tablet Take 1 tablet (40 mg total) by mouth daily at 6 PM. 30 tablet 0  . ENTRESTO 24-26 MG Take 1 tablet by mouth 2 (two) times daily.    Marland Kitchen FLUoxetine (PROZAC) 20 MG capsule Take 1 capsule (20 mg total) by mouth daily. 30 capsule 3  . furosemide (LASIX) 40 MG tablet Take 40 mg by mouth daily.    . metoprolol succinate (TOPROL-XL) 50 MG 24 hr tablet Take 50 mg by mouth 2 (two) times daily.    Marland Kitchen spironolactone (ALDACTONE) 25 MG tablet Take 25 mg by mouth daily.     No facility-administered medications prior to visit.    No Known Allergies  ROS Review of Systems    Objective:    Physical Exam  LMP 12/09/2011  Wt Readings from Last 3 Encounters:  02/19/20 270 lb 4.8 oz (122.6 kg)  01/05/20 269 lb 11.2 oz (122.3 kg)  12/18/11 240 lb 11.2 oz (109.2 kg)     Health Maintenance Due  Topic Date Due  . COVID-19 Vaccine (1) Never done  . PAP SMEAR-Modifier  05/23/2014  . INFLUENZA VACCINE  Never done    There are no preventive care reminders to display for this patient.  Lab Results  Component Value Date   TSH 1.811 09/20/2011   Lab Results  Component Value Date   WBC 6.1 12/28/2019   HGB 16.7 (H) 12/28/2019   HCT 51.4 (H) 12/28/2019   MCV 91.8  12/28/2019   PLT 297 12/28/2019   Lab Results  Component Value Date   NA 138 12/28/2019   K 4.1 12/28/2019   CO2 23 12/28/2019   GLUCOSE 93 12/28/2019   BUN 18 12/28/2019   CREATININE 0.94 12/28/2019   BILITOT 1.3 (H) 02/08/2008   ALKPHOS 82 02/08/2008   AST 19 02/08/2008   ALT 32 02/08/2008   PROT 6.9 02/08/2008   ALBUMIN 3.1 (L) 02/08/2008   CALCIUM 8.8 (L) 12/28/2019   ANIONGAP 9 12/28/2019   Lab Results  Component Value Date   CHOL 167 12/27/2019   Lab Results  Component Value Date   HDL 41 12/27/2019   Lab Results  Component Value Date   LDLCALC 102 (H) 12/27/2019   Lab Results  Component Value Date   TRIG 118 12/27/2019   Lab Results  Component Value Date   CHOLHDL 4.1 12/27/2019   Lab Results  Component Value Date   HGBA1C 6.0 (H) 12/26/2019      Assessment & Plan:   Problem List Items Addressed This Visit    None      No orders of the defined types were placed in this encounter.   Follow-up:  No follow-ups on file.    Elige Radon, MD

## 2020-05-19 NOTE — Assessment & Plan Note (Deleted)
Last seen in our clinic for this April 2021 at which time she was reportedly being referred to an EP specialist at [facility] for possible ablation. ** Anticoaguated on eliquis.  Rate/rhythm controlled on amiodarone and metoprolol.

## 2020-05-21 ENCOUNTER — Encounter: Payer: Medicaid Other | Admitting: Internal Medicine

## 2020-06-02 ENCOUNTER — Encounter: Payer: Self-pay | Admitting: Internal Medicine

## 2020-06-02 ENCOUNTER — Ambulatory Visit (INDEPENDENT_AMBULATORY_CARE_PROVIDER_SITE_OTHER): Payer: Self-pay | Admitting: Internal Medicine

## 2020-06-02 ENCOUNTER — Other Ambulatory Visit: Payer: Self-pay

## 2020-06-02 DIAGNOSIS — F329 Major depressive disorder, single episode, unspecified: Secondary | ICD-10-CM

## 2020-06-02 DIAGNOSIS — J302 Other seasonal allergic rhinitis: Secondary | ICD-10-CM

## 2020-06-02 DIAGNOSIS — F32A Depression, unspecified: Secondary | ICD-10-CM

## 2020-06-02 NOTE — Patient Instructions (Signed)
It was great seeing you again today. For your allergies, I would like you to start using the flonase twice a day for a week and then go back to using it daily. I think zyrtec will also be helpful but it would be good to run this by your cardiologist in the morning as well.  Please come back in 3 months so we can check in on your symptoms.

## 2020-06-05 ENCOUNTER — Encounter: Payer: Self-pay | Admitting: Internal Medicine

## 2020-06-05 DIAGNOSIS — J302 Other seasonal allergic rhinitis: Secondary | ICD-10-CM | POA: Insufficient documentation

## 2020-06-05 MED ORDER — FLUOXETINE HCL 20 MG PO CAPS: 20.0000 mg | ORAL_CAPSULE | Freq: Every day | ORAL | 3 refills | Status: DC

## 2020-06-05 NOTE — Progress Notes (Signed)
Office Visit   Patient ID: Lisa Schaefer, female    DOB: 27-May-1976, 44 y.o.   MRN: [id number]  Subjective:  CC: seasonal allergies  HPI 44 y.o. presents today for management of seasonal allergies. She notes that this occurs every fall. Sx include sinus congestion, rhinorrhea, and scratchy throat. She uses flonase intermittently.   She also relayed having some anxiety since the onset of her cardiac problems. She has been taking 20mg  prozac with fairly well control of her symptoms.      ACTIVE MEDICATIONS   Current Outpatient Medications on File Prior to Visit  Medication Sig Dispense Refill  . amiodarone (PACERONE) 200 MG tablet Take 200 mg by mouth 2 (two) times daily.    apixaban (ELIQUIS) 5 MG TABS tablet Take 5 mg by mouth 2 (two) times daily.    Marland Kitchen atorvastatin (LIPITOR) 40 MG tablet Take 1 tablet (40 mg total) by mouth daily at 6 PM. 30 tablet 0  . ENTRESTO 24-26 MG Take 1 tablet by mouth 2 (two) times daily.    Marland Kitchen FLUoxetine (PROZAC) 20 MG capsule Take 1 capsule (20 mg total) by mouth daily. 30 capsule 3  . furosemide (LASIX) 40 MG tablet Take 40 mg by mouth daily.    . metoprolol succinate (TOPROL-XL) 50 MG 24 hr tablet Take 50 mg by mouth 2 (two) times daily.    Marland Kitchen spironolactone (ALDACTONE) 25 MG tablet Take 25 mg by mouth daily.     No current facility-administered medications on file prior to visit.    ROS  Review of Systems  Constitutional: Negative for chills and fever.  HENT: Positive for congestion, rhinorrhea, sneezing and sore throat.   Respiratory: Negative for cough and shortness of breath.   Cardiovascular: Positive for palpitations. Negative for chest pain.    Objective:   BP 119/72 (BP Location: Left Arm, Patient Position: Sitting, Cuff Size: Large)   Pulse (!) 59   Temp 98.1 F (36.7 C) (Oral)   Ht 5\' 10"  (1.778 m)   Wt 278 lb 8 oz (126.3 kg)   LMP 12/09/2011   SpO2 98%   BMI 39.96 kg/m  Wt Readings from Last 3 Encounters:  06/02/20 278 lb 8  oz (126.3 kg)  02/19/20 270 lb 4.8 oz (122.6 kg)  01/05/20 269 lb 11.2 oz (122.3 kg)   BP Readings from Last 3 Encounters:  06/02/20 119/72  02/19/20 97/76  01/05/20 104/62   Physical Exam Constitutional:      Appearance: Normal appearance.  HENT:     Nose:     Comments: No pain with palpation over the sinuses. No nasal discharge on exam. Eyes:     Comments: No conjunctival injection on exam. No eye discharge  Cardiovascular:     Rate and Rhythm: Normal rate and regular rhythm.  Pulmonary:     Effort: Pulmonary effort is normal.     Breath sounds: Normal breath sounds.     Health Maintenance:   Health Maintenance  Topic Date Due  . COVID-19 Vaccine (2 - Moderna 2-dose series) 06/02/2020  . INFLUENZA VACCINE  06/18/2020 (Originally 04/18/2020)  . PAP SMEAR-Modifier  07/16/2020 (Originally 05/23/2014)  . TETANUS/TDAP  11/16/2021  . Hepatitis C Screening  Completed  . HIV Screening  Completed     Assessment & Plan:   Problem List Items Addressed This Visit      Other   Depression    She initially gave me the impression that her symptoms may not be  well controlled despite her PHQ 9 score of 2. However after further discussion, she relayed that she is not interested in increasing her prozac dose. Denies suicidal ideation today. Plan --continue prozac 20mg  --f/u in 6 mo for re-evaluation of sx      Seasonal allergies    Plan: Recommended she try to use flonase consistently. Use twice a day for a week and then daily thereafter.   We also discussed antihistamine use precautions given her cardiac history. UpToDate indicates zyrtec can be used safely and without interaction with amiodarone. I also recommended she discuss this with cardiology as she has an appointment with them in the morning.           Pt discussed with Dr. .  Lisa Done, MD Internal Medicine Resident PGY-2 [facility] Pager: [phone] 06/05/2020 8:32 PM

## 2020-06-05 NOTE — Assessment & Plan Note (Addendum)
She initially gave me the impression that her symptoms may not be well controlled despite her PHQ 9 score of 2. However after further discussion, she relayed that she is not interested in increasing her prozac dose. Denies suicidal ideation today. Plan --continue prozac 20mg . 3 mo refill sent to pharmacy --f/u in 6 mo for re-evaluation of sx

## 2020-06-05 NOTE — Assessment & Plan Note (Signed)
Plan: Recommended she try to use flonase consistently. Use twice a day for a week and then daily thereafter.   We also discussed antihistamine use precautions given her cardiac history. UpToDate indicates zyrtec can be used safely and without interaction with amiodarone. I also recommended she discuss this with cardiology as she has an appointment with them in the morning.

## 2020-06-09 NOTE — Progress Notes (Signed)
Internal Medicine Clinic Attending  Case discussed with Dr. Christian at the time of the visit.  We reviewed the resident's history and exam and pertinent patient test results.  I agree with the assessment, diagnosis, and plan of care documented in the resident's note.  Tyffany Waldrop, M.D., Ph.D.  

## 2020-06-16 ENCOUNTER — Ambulatory Visit: Payer: Medicaid Other

## 2020-07-05 ENCOUNTER — Ambulatory Visit: Payer: Medicaid Other

## 2020-07-19 ENCOUNTER — Ambulatory Visit: Payer: Medicaid Other

## 2020-07-19 ENCOUNTER — Other Ambulatory Visit: Payer: Self-pay

## 2020-09-01 ENCOUNTER — Telehealth: Payer: Self-pay | Admitting: *Deleted

## 2020-09-01 ENCOUNTER — Encounter: Payer: Medicaid Other | Admitting: Internal Medicine

## 2020-09-01 DIAGNOSIS — Z0000 Encounter for general adult medical examination without abnormal findings: Secondary | ICD-10-CM | POA: Insufficient documentation

## 2020-09-01 NOTE — Telephone Encounter (Signed)
Called patient to r/s today's missed appt. Moved to 09/06/2020 at 2:45. Also advised patient to clear VMB as we were unable to leave a message yesterday to confirm appt. States she will . L. Kaisy Severino, BSN, RN-BC

## 2020-09-03 NOTE — Progress Notes (Signed)
This encounter was created in error - please disregard.

## 2020-09-06 ENCOUNTER — Encounter: Payer: Medicaid Other | Admitting: Internal Medicine

## 2020-09-20 ENCOUNTER — Encounter: Payer: Medicaid Other | Admitting: Student

## 2020-10-05 ENCOUNTER — Encounter: Payer: Medicaid Other | Admitting: Student

## 2020-10-14 ENCOUNTER — Other Ambulatory Visit: Payer: Self-pay | Admitting: Internal Medicine

## 2020-10-14 DIAGNOSIS — F32A Depression, unspecified: Secondary | ICD-10-CM

## 2020-10-16 ENCOUNTER — Other Ambulatory Visit: Payer: Self-pay | Admitting: Internal Medicine

## 2020-10-16 DIAGNOSIS — F32A Depression, unspecified: Secondary | ICD-10-CM

## 2020-11-14 ENCOUNTER — Other Ambulatory Visit: Payer: Self-pay | Admitting: Internal Medicine

## 2020-11-14 DIAGNOSIS — F32A Depression, unspecified: Secondary | ICD-10-CM

## 2021-05-12 ENCOUNTER — Other Ambulatory Visit: Payer: Self-pay | Admitting: Student

## 2021-05-12 DIAGNOSIS — F32A Depression, unspecified: Secondary | ICD-10-CM

## 2021-05-16 NOTE — Telephone Encounter (Signed)
Requesting to speak with a nurse about getting refill on FLUoxetine (PROZAC) 20 MG capsule. Please call pt back.

## 2021-05-19 ENCOUNTER — Encounter: Payer: Medicaid Other | Admitting: Internal Medicine

## 2021-05-25 ENCOUNTER — Encounter: Payer: Medicaid Other | Admitting: Internal Medicine

## 2021-06-02 ENCOUNTER — Other Ambulatory Visit: Payer: Self-pay

## 2021-06-02 ENCOUNTER — Ambulatory Visit (INDEPENDENT_AMBULATORY_CARE_PROVIDER_SITE_OTHER): Payer: Self-pay | Admitting: Internal Medicine

## 2021-06-02 ENCOUNTER — Encounter: Payer: Self-pay | Admitting: Internal Medicine

## 2021-06-02 VITALS — BP 123/68 | HR 67 | Temp 97.6°F | Ht 70.0 in | Wt 307.6 lb

## 2021-06-02 DIAGNOSIS — Z6841 Body Mass Index (BMI) 40.0 and over, adult: Secondary | ICD-10-CM

## 2021-06-02 DIAGNOSIS — F32A Depression, unspecified: Secondary | ICD-10-CM

## 2021-06-02 MED ORDER — FLUOXETINE HCL 20 MG PO CAPS: 20.0000 mg | ORAL_CAPSULE | Freq: Every day | ORAL | 7 refills | Status: DC

## 2021-06-03 ENCOUNTER — Encounter: Payer: Self-pay | Admitting: Internal Medicine

## 2021-06-03 DIAGNOSIS — E66813 Obesity, class 3: Secondary | ICD-10-CM | POA: Insufficient documentation

## 2021-06-03 DIAGNOSIS — Z6841 Body Mass Index (BMI) 40.0 and over, adult: Secondary | ICD-10-CM | POA: Insufficient documentation

## 2021-06-03 LAB — TSH: TSH: 2.76 u[IU]/mL (ref 0.450–4.500)

## 2021-06-03 NOTE — Assessment & Plan Note (Addendum)
She is interested in weight loss options at today's visit. We discussed the two main options being medical vs surgical management. She would like to pursue medical management. Discussed the medical weight loss clinic which she is interested in going to. Unfortunately, she is uninsured which is precluding any form of medical management right now. We reviewed lifestyle modifications at today's visit.   We will check a TSH today  She will work on getting the orange card for now and will follow up after completion.

## 2021-06-03 NOTE — Progress Notes (Signed)
Office Visit   Patient ID: Lisa Schaefer, female    DOB: 08-23-1976, 45 y.o.   MRN: [id number]   PCP: Elige Radon, MD   Subjective:  Lisa Schaefer is a 45 y.o. year old female who presents for follow up of depression and obesity. Please refer to problem based charting for assessment and plan.   ACTIVE MEDICATIONS   Outpatient Medications Prior to Visit  Medication Sig   amiodarone (PACERONE) 200 MG tablet Take 200 mg by mouth daily.   apixaban (ELIQUIS) 5 MG TABS tablet Take 5 mg by mouth 2 (two) times daily.   ENTRESTO 24-26 MG Take 1 tablet by mouth 2 (two) times daily.   furosemide (LASIX) 40 MG tablet Take 40 mg by mouth as needed.   metoprolol succinate (TOPROL-XL) 50 MG 24 hr tablet Take 25 mg by mouth daily.   spironolactone (ALDACTONE) 25 MG tablet Take 25 mg by mouth daily.   [DISCONTINUED] atorvastatin (LIPITOR) 40 MG tablet Take 1 tablet (40 mg total) by mouth daily at 6 PM.   [DISCONTINUED] FLUoxetine (PROZAC) 20 MG capsule Take 1 capsule by mouth once daily   No facility-administered medications prior to visit.     Objective:   BP 123/68 (BP Location: Left Arm, Patient Position: Sitting, Cuff Size: Large)   Pulse 67   Temp 97.6 F (36.4 C) (Oral)   Ht 5\' 10"  (1.778 m)   Wt (!) 307 lb 9.6 oz (139.5 kg)   LMP 12/09/2011   SpO2 100%   BMI 44.14 kg/m  Wt Readings from Last 3 Encounters:  06/02/21 (!) 307 lb 9.6 oz (139.5 kg)  06/02/20 278 lb 8 oz (126.3 kg)  02/19/20 270 lb 4.8 oz (122.6 kg)    BP Readings from Last 3 Encounters:  06/02/21 123/68  06/02/20 119/72  02/19/20 97/76   General: well appearing obese female Cardiac: RRR Pulm: lungs clear  Health Maintenance:   Health Maintenance  Topic Date Due   PAP SMEAR-Modifier  06/03/2021 (Originally 05/23/2014)   COVID-19 Vaccine (2 - Moderna series) 06/19/2021 (Originally 06/02/2020)   INFLUENZA VACCINE  12/16/2021 (Originally 04/18/2021)   COLONOSCOPY (Pts 45-47yrs Insurance coverage will need to  be confirmed)  05/21/2024 (Originally 05/21/2019)   TETANUS/TDAP  11/16/2021   Hepatitis C Screening  Completed   HIV Screening  Completed   Pneumococcal Vaccine 57-34 Years old  Aged Out   HPV VACCINES  Aged Out    Assessment & Plan:   Problem List Items Addressed This Visit       Other   Depression    Symptoms are well controlled on prozac 20mg . Continue current management.      Relevant Medications   FLUoxetine (PROZAC) 20 MG capsule   Class 3 severe obesity with body mass index (BMI) of 40.0 to 44.9 in adult [facility]) - Primary    She is interested in weight loss options at today's visit. We discussed the two main options being medical vs surgical management. She would like to pursue medical management. Discussed the medical weight loss clinic which she is interested in going to. Unfortunately, she is uninsured which is precluding any form of medical management right now. We reviewed lifestyle modifications at today's visit.  We will check a TSH today She will work on getting the orange card for now and will follow up after completion.      Relevant Orders   TSH (Completed)    Return in about 1 year (around 06/02/2022).  Pt discussed with Dr. Fredrich Romans, MD Internal Medicine Resident PGY-3 [facility] 06/03/2021 7:38 AM

## 2021-06-03 NOTE — Assessment & Plan Note (Signed)
In sinus rhythm today

## 2021-06-03 NOTE — Assessment & Plan Note (Signed)
Symptoms are well controlled on prozac 20mg . Continue current management.

## 2021-11-11 IMAGING — MR MR HEAD W/O CM
12 of 13 series · 44 of 48 positions shown · non-contrast
Comparison: None.

CLINICAL DATA: Numbness and tingling of the left arm.

EXAM:
MRI HEAD WITHOUT CONTRAST
TECHNIQUE: Multiplanar, multiecho pulse sequences of the brain and surrounding
structures were obtained without intravenous contrast.

[Series 5: DWI · axial · 3.0mm · 0.88mm/px · z∈[-99,+48]mm · 8 of 100 slices shown (1 of 4)]
[im 1/100]
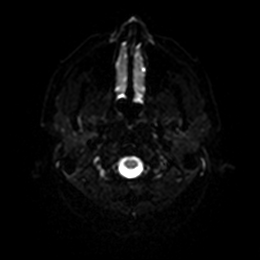
[im 15/100]
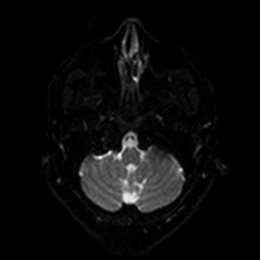
[im 29/100]
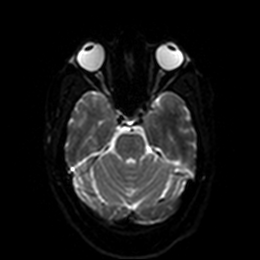
[im 43/100]
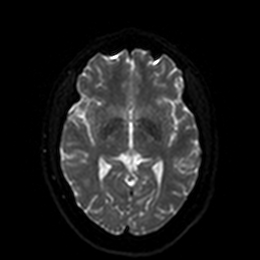
[im 57/100]
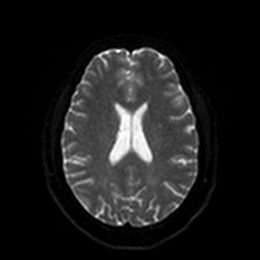
[im 71/100]
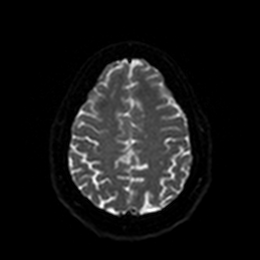
[im 85/100]
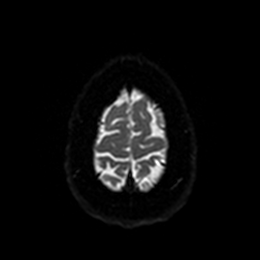
[im 100/100]
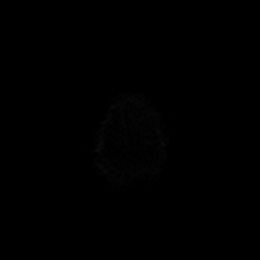

[Series 6: DWI · axial · 3.0mm · 0.88mm/px · z∈[-99,+48]mm · 5 of 50 slices shown (2 of 4)]
[im 1/50]
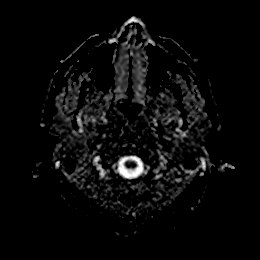
[im 13/50]
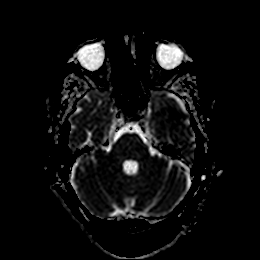
[im 25/50]
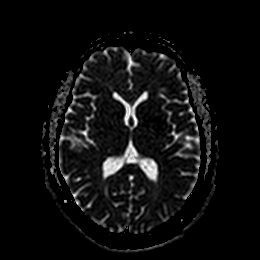
[im 37/50]
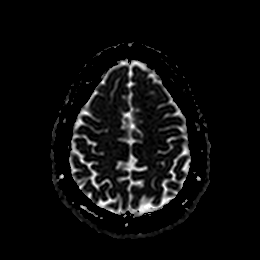
[im 50/50]
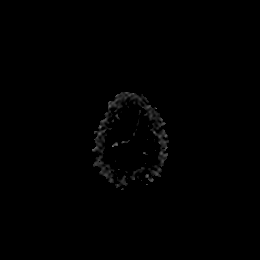

[Series 7: DWI · coronal · 4.0mm · 0.88mm/px · 5 of 64 slices shown (3 of 4)]
[im 1/64]
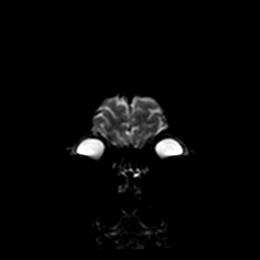
[im 16/64]
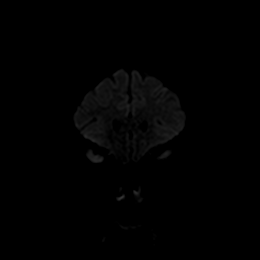
[im 32/64]
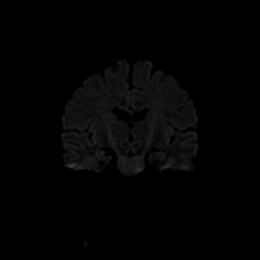
[im 48/64]
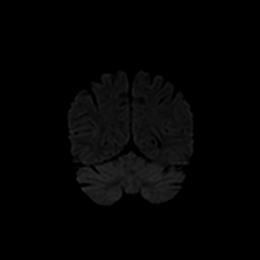
[im 64/64]
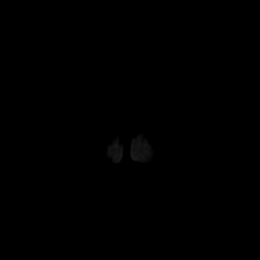

[Series 8: DWI · coronal · 4.0mm · 0.88mm/px · 2 of 32 slices shown (4 of 4)]
[im 1/32]
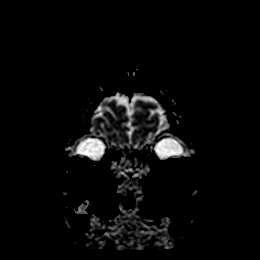
[im 32/32]
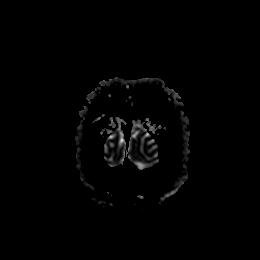

[Series 9: T1 · sagittal · 5.0mm · 0.75mm/px · 2 of 23 slices shown]
[im 1/23]
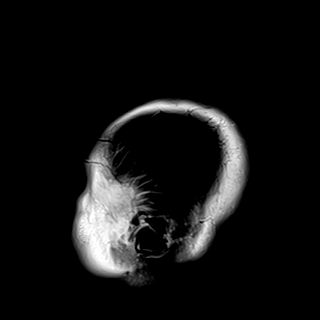
[im 23/23]
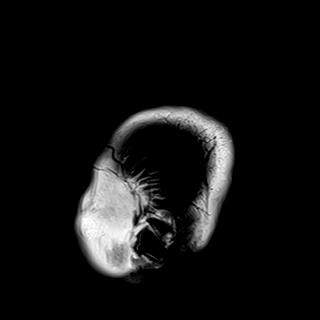

[Series 10: T2 · axial · 5.0mm · 0.72mm/px · z∈[-98,+46]mm · 2 of 25 slices shown (1 of 2)]
[im 1/25]
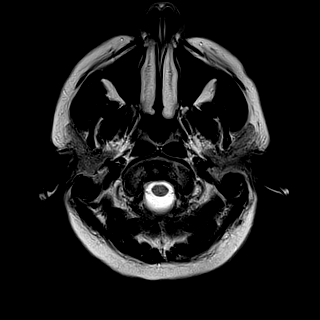
[im 25/25]
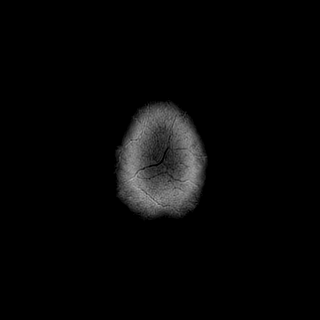

[Series 11: FLAIR · axial · 5.0mm · 0.45mm/px · z∈[-99,+45]mm · 2 of 25 slices shown]
[im 1/25]
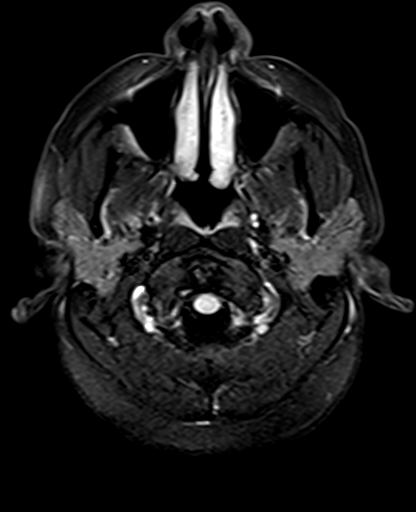
[im 25/25]
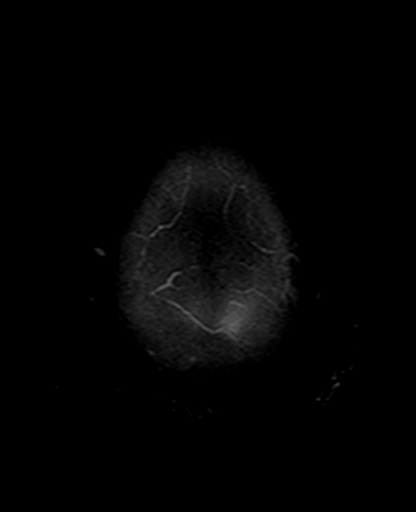

[Series 12: mag_images · axial · 3.0mm · 0.90mm/px · z∈[-115,+62]mm · 4 of 60 slices shown]
[im 1/60]
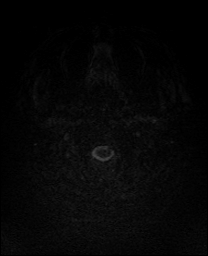
[im 20/60]
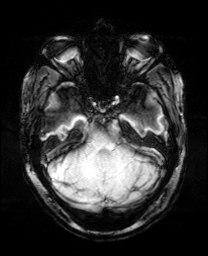
[im 40/60]
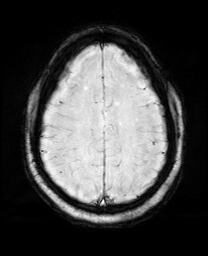
[im 60/60]
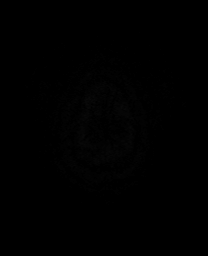

[Series 13: pha_images · axial · 3.0mm · 0.90mm/px · z∈[-115,+62]mm · 4 of 60 slices shown]
[im 1/60]
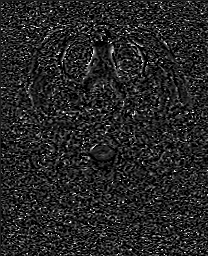
[im 20/60]
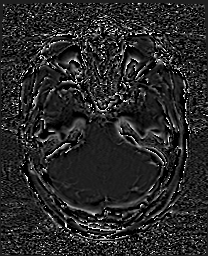
[im 40/60]
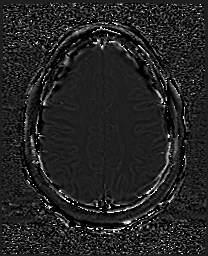
[im 60/60]
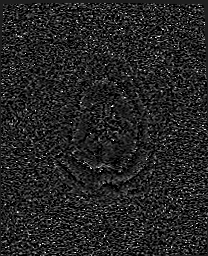

[Series 14: swi_images · axial · 3.0mm · 0.90mm/px · z∈[-115,+62]mm · 4 of 60 slices shown]
[im 1/60]
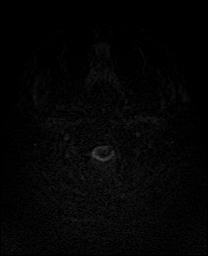
[im 20/60]
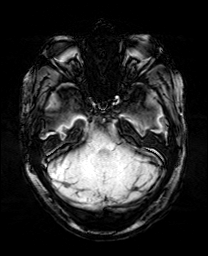
[im 40/60]
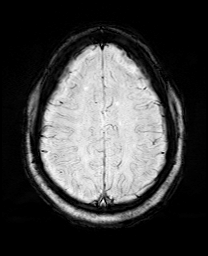
[im 60/60]
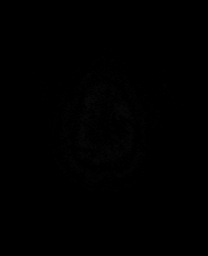

[Series 15: mip_images(sw) · axial · 24.0mm · 0.90mm/px · z∈[-105,+51]mm · 4 of 53 slices shown]
[im 1/53]
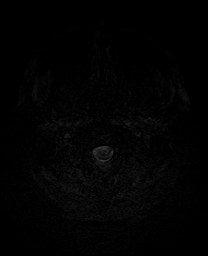
[im 18/53]
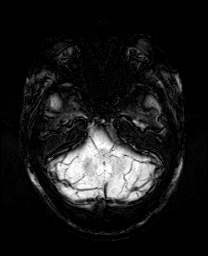
[im 35/53]
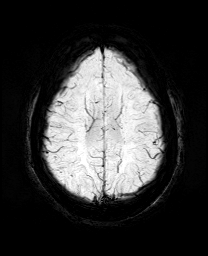
[im 53/53]
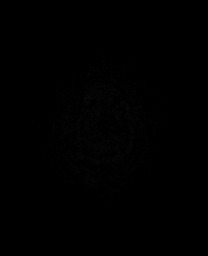

[Series 17: T2 · coronal · 5.0mm · 0.34mm/px · 2 of 29 slices shown (2 of 2)]
[im 1/29]
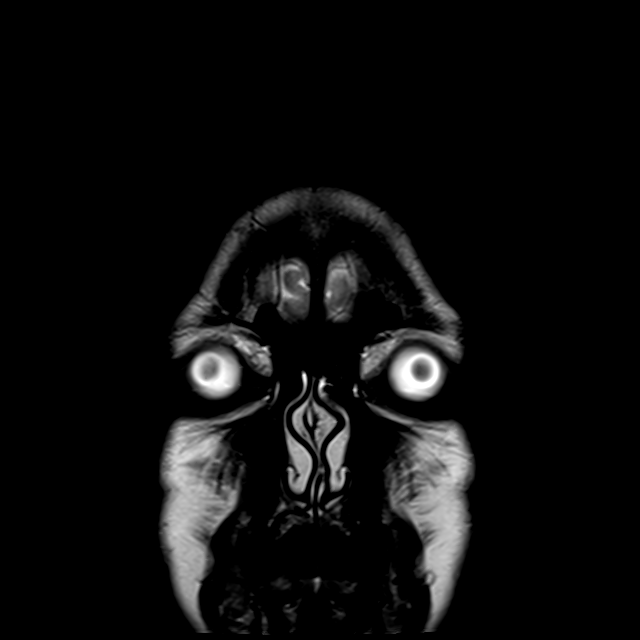
[im 29/29]
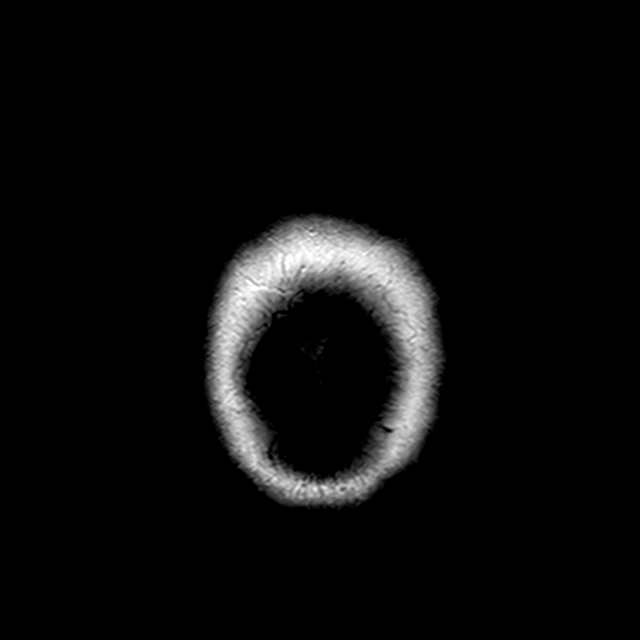

[44 of 48 positions shown; findings below may reference images not displayed]

FINDINGS: Brain: No abnormality is seen affecting the brainstem or cerebellum.
Cerebral hemispheres show a few small foci of T2 and FLAIR signal in
the white matter consistent with minimal small vessel change. There
is question of a punctate acute infarction at each frontoparietal
vertex. These are tiny findings and, being at the periphery of the
brain, could potentially be artifactual. However, my concern is that
of micro embolic infarctions. No mass effect or hemorrhage.
Hydrocephalus, mass lesion or extra-axial collection.

Vascular: Major vessels at the base of the brain show flow.

Skull and upper cervical spine: Negative

Sinuses/Orbits: Clear/normal

Other: None
IMPRESSION: Question 2 punctate foci of restricted diffusion at both
frontoparietal vertex regions. These are tiny and, being at the
periphery of the brain, could potentially be artifactual. However,
my suspicion is that these could represent tiny micro embolic
infarctions.

## 2022-02-16 ENCOUNTER — Other Ambulatory Visit: Payer: Self-pay | Admitting: *Deleted

## 2022-02-16 DIAGNOSIS — F32A Depression, unspecified: Secondary | ICD-10-CM

## 2022-02-17 MED ORDER — FLUOXETINE HCL 20 MG PO CAPS: 20.0000 mg | ORAL_CAPSULE | Freq: Every day | ORAL | 7 refills | Status: DC

## 2022-03-13 ENCOUNTER — Encounter: Payer: Self-pay | Admitting: *Deleted

## 2022-11-24 ENCOUNTER — Other Ambulatory Visit: Payer: Self-pay | Admitting: Internal Medicine

## 2022-11-24 DIAGNOSIS — F32A Depression, unspecified: Secondary | ICD-10-CM

## 2022-12-07 NOTE — Telephone Encounter (Signed)
PCP name will be removed. Patient is establishing care at [facility].  Future appointment is 12/20/22 with Dr. Marijo File.

## 2022-12-23 ENCOUNTER — Other Ambulatory Visit: Payer: Self-pay | Admitting: Student

## 2022-12-23 DIAGNOSIS — F32A Depression, unspecified: Secondary | ICD-10-CM

## 2023-01-06 ENCOUNTER — Other Ambulatory Visit: Payer: Self-pay | Admitting: Student

## 2023-01-06 DIAGNOSIS — F32A Depression, unspecified: Secondary | ICD-10-CM

## 2023-01-25 ENCOUNTER — Ambulatory Visit: Payer: Medicaid Other | Admitting: Student

## 2023-02-05 ENCOUNTER — Ambulatory Visit: Payer: Medicaid Other | Admitting: Student

## 2023-02-05 NOTE — Progress Notes (Deleted)
   CC: New patient establishment  HPI:  [name] is a 47 y.o. female with past medical history of HFrecEF, atrial flutter, depression who presents for new clinic visit.  HFrEF: Metoprolol succinate 25 mg daily, spironolactone 25 mg daily, Entresto 24-26 mg twice daily, Lasix 40 mg as needed  Atrial flutter: sp ablation in 06/2021, 01/2020  Depression: Fluoxetine 20 mg daily  TIA: Aspirin 81 mg daily GERD: Pantoprazole 40 mg daily  History: Medical: HFrEF (EF 55-60% on 06/2021), depression, atrial flutter Medications: Metoprolol succinate, spironolactone, Entresto, Lasix, amiodarone, fluoxetine Surgical: Family: Social: Allergies:  Most recent labs: BMP: 06/29/2021 sodium 138, potassium 4.5, creatinine 0.90 CBC: White count 5.5, hemoglobin 14, hematocrit 42.6, platelets 270 TSH: 06/02/2021: 2.76 A1c: 12/26/2019: 6.0 Lipid panel: 0/06/2020: Total cholesterol 167, LDL 102, triglycerides 118    Past Medical History:  Diagnosis Date   Chronic systolic (congestive) heart failure (HCC)    CVA (cerebral vascular accident) (HCC) 12/26/2019   Obesity    Persistent atrial fibrillation (HCC)    PID (acute pelvic inflammatory disease) 2009     Current Outpatient Medications:    amiodarone (PACERONE) 200 MG tablet, Take 200 mg by mouth daily., Disp: , Rfl:    apixaban (ELIQUIS) 5 MG TABS tablet, Take 5 mg by mouth 2 (two) times daily., Disp: , Rfl:    ENTRESTO 24-26 MG, Take 1 tablet by mouth 2 (two) times daily., Disp: , Rfl:    FLUoxetine (PROZAC) 20 MG capsule, Take 1 capsule by mouth once daily, Disp: 30 capsule, Rfl: 0   furosemide (LASIX) 40 MG tablet, Take 40 mg by mouth as needed., Disp: , Rfl:    metoprolol succinate (TOPROL-XL) 50 MG 24 hr tablet, Take 25 mg by mouth daily., Disp: , Rfl:    spironolactone (ALDACTONE) 25 MG tablet, Take 25 mg by mouth daily., Disp: , Rfl:   Review of Systems:   ***  Constitutional: Eye: Respiratory: Cardiovascular: GI: MSK: GU: Skin: Neuro: Endocrine:   Physical Exam:  There were no vitals filed for this visit. *** General: Patient is sitting comfortably in the room  Eyes: Pupils equal and reactive to light, EOM intact  Head: Normocephalic, atraumatic  Neck: Supple, nontender, full range of motion, No JVD Cardio: Regular rate and rhythm, no murmurs, rubs or gallops. 2+ pulses to bilateral upper and lower extremities  Chest: No chest tenderness Pulmonary: Clear to ausculation bilaterally with no rales, rhonchi, and crackles  Abdomen: Soft, nontender with normoactive bowel sounds with no rebound or guarding  Neuro: Alert and orientated x3. CN II-XII intact. Sensation intact to upper and lower extremities. 2+ patellar reflex.  Back: No midline tenderness, no step off or deformities noted. No paraspinal muscle tenderness.  Skin: No rashes noted  MSK: 5/5 strength to upper and lower extremities.    Assessment & Plan:   No problem-specific Assessment & Plan notes found for this encounter.    Patient {GC/GE:[id number]::"discussed with","seen with"} Dr. {NAMES:[id number]::"Guilloud","Hoffman","Mullen","Narendra","Williams","Vincent"}  Modena Slater, DO PGY-1 Internal Medicine Resident  Pager: [phone]
